# Patient Record
Sex: Male | Born: 1955 | Race: Black or African American | Hispanic: No | Smoking: Current every day smoker
Health system: Southern US, Community
[De-identification: ages and names within clinical notes are randomized; demographics above are authoritative.]

## PROBLEM LIST (undated history)

## (undated) DIAGNOSIS — M1990 Unspecified osteoarthritis, unspecified site: Secondary | ICD-10-CM

## (undated) DIAGNOSIS — I10 Essential (primary) hypertension: Secondary | ICD-10-CM

## (undated) DIAGNOSIS — M109 Gout, unspecified: Secondary | ICD-10-CM

## (undated) HISTORY — PX: COLONOSCOPY: SHX174

## (undated) HISTORY — PX: TONSILLECTOMY: SUR1361

## (undated) HISTORY — PX: BACK SURGERY: SHX140

## (undated) HISTORY — PX: WRIST SURGERY: SHX841

---

## 2009-08-05 ENCOUNTER — Ambulatory Visit (HOSPITAL_COMMUNITY): Admission: RE | Admit: 2009-08-05 | Discharge: 2009-08-05 | Payer: Self-pay | Admitting: General Practice

## 2016-04-10 ENCOUNTER — Other Ambulatory Visit: Payer: Self-pay | Admitting: Neurological Surgery

## 2016-04-10 DIAGNOSIS — M4120 Other idiopathic scoliosis, site unspecified: Secondary | ICD-10-CM

## 2016-04-24 ENCOUNTER — Ambulatory Visit
Admission: RE | Admit: 2016-04-24 | Discharge: 2016-04-24 | Disposition: A | Discharge status: Home / Self Care | Payer: Managed Care, Other (non HMO) | Source: Ambulatory Visit | Attending: Neurological Surgery | Admitting: Neurological Surgery

## 2016-04-24 DIAGNOSIS — M4120 Other idiopathic scoliosis, site unspecified: Secondary | ICD-10-CM

## 2016-04-24 MED ORDER — ONDANSETRON HCL 4 MG/2ML IJ SOLN: 4.0000 mg | Freq: Four times a day (QID) | INTRAMUSCULAR | Status: DC | PRN

## 2016-04-24 MED ORDER — IOPAMIDOL (ISOVUE-M 200) INJECTION 41%
15.0000 mL | Freq: Once | INTRAMUSCULAR | Status: AC
  Administered 2016-04-24: 15 mL via INTRATHECAL

## 2016-04-24 MED ORDER — ONDANSETRON HCL 4 MG/2ML IJ SOLN
4.0000 mg | Freq: Once | INTRAMUSCULAR | Status: AC
  Administered 2016-04-24: 4 mg via INTRAMUSCULAR

## 2016-04-24 MED ORDER — DIAZEPAM 5 MG PO TABS
5.0000 mg | ORAL_TABLET | Freq: Once | ORAL | Status: AC
  Administered 2016-04-24: 5 mg via ORAL

## 2016-04-24 MED ORDER — MEPERIDINE HCL 100 MG/ML IJ SOLN
50.0000 mg | Freq: Once | INTRAMUSCULAR | Status: AC
  Administered 2016-04-24: 50 mg via INTRAMUSCULAR

## 2016-04-24 NOTE — Discharge Instructions (Signed)

## 2016-04-27 ENCOUNTER — Telehealth: Payer: Self-pay

## 2016-04-27 NOTE — Telephone Encounter (Signed)
Spoke with patient after his myelogram here 10/16 /17.  He is at work and doing well.  Denies headache.  jkl

## 2016-05-10 ENCOUNTER — Other Ambulatory Visit: Payer: Self-pay | Admitting: Neurological Surgery

## 2016-05-29 ENCOUNTER — Inpatient Hospital Stay (HOSPITAL_COMMUNITY)
Admission: RE | Admit: 2016-05-29 | Discharge: 2016-05-29 | Disposition: A | Discharge status: Home / Self Care | Payer: Managed Care, Other (non HMO) | Source: Ambulatory Visit

## 2016-06-06 MED ORDER — TOBRAMYCIN SULFATE 1.2 G IJ SOLR
INTRAMUSCULAR | Status: AC
  Filled 2016-06-06: qty 2.4

## 2016-06-06 MED ORDER — GENTAMICIN SULFATE 40 MG/ML IJ SOLN
INTRAMUSCULAR | Status: AC
  Filled 2016-06-06: qty 4

## 2016-06-22 ENCOUNTER — Inpatient Hospital Stay (HOSPITAL_COMMUNITY): Admission: RE | Admit: 2016-06-22 | Payer: Managed Care, Other (non HMO) | Source: Ambulatory Visit

## 2016-07-27 NOTE — Pre-Procedure Instructions (Signed)
Donald Romero  07/27/2016      [id number] [place], [place] - [address] [place] [address] Phone: [phone] Fax: [phone]    Your procedure is scheduled on Monday, January 29.  Report to [facility] Admitting at 5:30 AM                Your surgery or procedure is scheduled for 7:30 AM   Call this number if you have problems the morning of surgery:[phone]                For any other questions, please call [phone], Monday - Friday 8 AM - 4 PM.     Remember:  Do not eat food or drink liquids after midnight Sunday, January 28.  Take these medicines the morning of surgery with A SIP OF WATER :amLODipine (NORVASC), buPROPion (WELLBUTRIN SR).                  1 Week prior to surgery STOP taking Aspirin, Aspirin Products (Goody Powder, Excedrin Migraine), Ibuprofen (Advil), Naproxen (Aleve) ,indomethacin (INDOCIN),  Vitamins and Herbal Products (ie Fish Oil).                 [place]- Preparing For Surgery  Before surgery, you can play an important role. Because skin is not sterile, your skin needs to be as free of germs as possible. You can reduce the number of germs on your skin by washing with CHG (chlorahexidine gluconate) Soap before surgery.  CHG is an antiseptic cleaner which kills germs and bonds with the skin to continue killing germs even after washing.  Please do not use if you have an allergy to CHG or antibacterial soaps. If your skin becomes reddened/irritated stop using the CHG.  Do not shave (including legs and underarms) for at least 48 hours prior to first CHG shower. It is OK to shave your face.  Please follow these instructions carefully.   1. Shower the NIGHT BEFORE SURGERY and the MORNING OF SURGERY with CHG.   2. If you chose to wash your hair, wash your hair first as usual with your normal shampoo.  3. After you shampoo, rinse your hair and body thoroughly to remove the shampoo.  4. Use CHG as you would  any other liquid soap. You can apply CHG directly to the skin and wash gently with a scrungie or a clean washcloth.   5. Apply the CHG Soap to your body ONLY FROM THE NECK DOWN.  Do not use on open wounds or open sores. Avoid contact with your eyes, ears, mouth and genitals (private parts). Wash genitals (private parts) with your normal soap.  6. Wash thoroughly, paying special attention to the area where your surgery will be performed.  7. Thoroughly rinse your body with warm water from the neck down.  8. DO NOT shower/wash with your normal soap after using and rinsing off the CHG Soap.  9. Pat yourself dry with a CLEAN TOWEL.   10. Wear CLEAN PAJAMAS   11. Place CLEAN SHEETS on your bed the night of your first shower and DO NOT SLEEP WITH PETS.  Day of Surgery: Do not apply any deodorants/lotions. Please wear clean clothes to the hospital/surgery center.    Do not wear jewelry, make-up or nail polish.  Do not wear lotions, powders, or perfumes, or deodorant.  Men may shave face and neck.  Do not bring valuables to the  hospital.  [facility] is not responsible for any belongings or valuables.  Contacts, dentures or bridgework may not be worn into surgery.  Leave your suitcase in the car.  After surgery it may be brought to your room.  For patients admitted to the hospital, discharge time will be determined by your treatment team.  Patients discharged the day of surgery will not be allowed to drive home.   Special instructions:  -  Please read over the following fact sheets that you were given: [facility]- Preparing For Surgery and Patient Instructions for Mupirocin Application, Coughing and Deep Breathing, Pain Booklet

## 2016-07-28 ENCOUNTER — Encounter (HOSPITAL_COMMUNITY)
Admission: RE | Admit: 2016-07-28 | Discharge: 2016-07-28 | Disposition: A | Discharge status: Home / Self Care | Payer: Managed Care, Other (non HMO) | Source: Ambulatory Visit | Attending: Neurological Surgery | Admitting: Neurological Surgery

## 2016-07-28 ENCOUNTER — Encounter (HOSPITAL_COMMUNITY): Payer: Self-pay

## 2016-07-28 DIAGNOSIS — I10 Essential (primary) hypertension: Secondary | ICD-10-CM | POA: Diagnosis present

## 2016-07-28 DIAGNOSIS — Z01812 Encounter for preprocedural laboratory examination: Secondary | ICD-10-CM | POA: Diagnosis present

## 2016-07-28 DIAGNOSIS — Z01810 Encounter for preprocedural cardiovascular examination: Secondary | ICD-10-CM | POA: Insufficient documentation

## 2016-07-28 HISTORY — DX: Unspecified osteoarthritis, unspecified site: M19.90

## 2016-07-28 HISTORY — DX: Essential (primary) hypertension: I10

## 2016-07-28 HISTORY — DX: Gout, unspecified: M10.9

## 2016-07-28 LAB — CBC
HEMATOCRIT: 42.2 % (ref 39.0–52.0)
HEMOGLOBIN: 14.1 g/dL (ref 13.0–17.0)
MCH: 29.9 pg (ref 26.0–34.0)
MCHC: 33.4 g/dL (ref 30.0–36.0)
MCV: 89.4 fL (ref 78.0–100.0)
Platelets: 240 10*3/uL (ref 150–400)
RBC: 4.72 MIL/uL (ref 4.22–5.81)
RDW: 13.9 % (ref 11.5–15.5)
WBC: 8.7 10*3/uL (ref 4.0–10.5)

## 2016-07-28 LAB — BASIC METABOLIC PANEL
Anion gap: 11 (ref 5–15)
BUN: 20 mg/dL (ref 6–20)
CALCIUM: 9.7 mg/dL (ref 8.9–10.3)
CO2: 21 mmol/L — AB (ref 22–32)
CREATININE: 1.85 mg/dL — AB (ref 0.61–1.24)
Chloride: 108 mmol/L (ref 101–111)
GFR calc non Af Amer: 38 mL/min — ABNORMAL LOW (ref >60)
GFR, EST AFRICAN AMERICAN: 44 mL/min — AB (ref >60)
GLUCOSE: 121 mg/dL — AB (ref 65–99)
Potassium: 3.9 mmol/L (ref 3.5–5.1)
Sodium: 140 mmol/L (ref 135–145)

## 2016-07-28 LAB — APTT: aPTT: 27 seconds (ref 24–36)

## 2016-07-28 LAB — TYPE AND SCREEN
ABO/RH(D): B POS
Antibody Screen: NEGATIVE

## 2016-07-28 LAB — SURGICAL PCR SCREEN
MRSA, PCR: INVALID — AB
STAPHYLOCOCCUS AUREUS: INVALID — AB

## 2016-07-28 LAB — ABO/RH: ABO/RH(D): B POS

## 2016-07-28 LAB — PROTIME-INR
INR: 0.98
PROTHROMBIN TIME: 13 s (ref 11.4–15.2)

## 2016-07-28 NOTE — Progress Notes (Signed)
PCR result was late in resulting, called [facility] and they stated that they had run it twice and showed "inconclusive". They are running a culture on it and should have the results Saturday AM.

## 2016-07-28 NOTE — Progress Notes (Signed)
PCP - Dr. Harvel Qualeajendra S. Brent Bullarivedi, MD Cardiologist - denies  Chest x-ray - not needed EKG - 07/28/16 Stress Test - denies ECHO - denies Cardiac Cath - denies Sleep Study - denies  Requesting records from PCP   Patient denies shortness of breath, fever, cough and chest pain at PAT appointment   Patient verbalized understanding of instructions that was given to them at the PAT appointment. Patient expressed that there were no further questions.  Patient was also instructed that they will need to review over the PAT instructions again at home before the surgery.

## 2016-07-30 LAB — MRSA CULTURE: Culture: NOT DETECTED

## 2016-07-31 ENCOUNTER — Encounter (HOSPITAL_COMMUNITY): Payer: Self-pay | Admitting: Emergency Medicine

## 2016-07-31 NOTE — Progress Notes (Addendum)
Anesthesia Chart Review:  Pt is a 61 year old male scheduled for L2-S1 laminectomy for decompression, T10-pelvis fixation/fusion with robotic assistance on 08/07/2016 with Cherrie DistanceBenjamin Ditty, MD.   - PCP is Glori Bickersajendra Trivedi, [place] in [place]  PMH includes:  HTN. Current smoker. BMI 31  Medications include: amlodipine.   Preoperative labs reviewed.  Cr 1.85, BUN 20. This is consistent with labs from PCP's office dated 07/13/16.   EKG 07/28/16: NSR.   Rica Mastngela Kabbe, FNP-BC [facility]/Anesthesiology Phone: [phone] 07/31/2016 4:47 PM  Addendum: Beverely LowJeannie from Dr. Emeterio Reeverivedi's office called. She reported that he had reviewed PAT labs forwarded by Rica MastAngela Kabbe. He recommends that patient be evaluated by nephrology prior to surgery. Patient apparently has an appointment at Dr. Emeterio Reeverivedi's office on 08/03/16 and nephrology referral will be initiated at that time. I have left a voice message with Shanda BumpsJessica at Dr. Mervyn Gayitty's office regarding this.  Velna Ochsllison Perle Gibbon, PA-C [facility]/Anesthesiology Phone [phone] 08/01/2016 9:12 AM

## 2016-08-07 ENCOUNTER — Encounter (HOSPITAL_COMMUNITY): Admission: RE | Payer: Self-pay | Source: Ambulatory Visit

## 2016-08-07 ENCOUNTER — Inpatient Hospital Stay (HOSPITAL_COMMUNITY)
Admission: RE | Admit: 2016-08-07 | Payer: Managed Care, Other (non HMO) | Source: Ambulatory Visit | Admitting: Neurological Surgery

## 2016-08-07 SURGERY — POSTERIOR LUMBAR FUSION 4 LEVEL
Anesthesia: General

## 2016-08-16 ENCOUNTER — Other Ambulatory Visit: Payer: Self-pay | Admitting: Neurological Surgery

## 2016-08-22 ENCOUNTER — Other Ambulatory Visit (HOSPITAL_COMMUNITY): Payer: Self-pay | Admitting: Neurological Surgery

## 2016-08-22 DIAGNOSIS — M5416 Radiculopathy, lumbar region: Secondary | ICD-10-CM

## 2016-09-11 ENCOUNTER — Encounter (HOSPITAL_COMMUNITY)
Admission: RE | Admit: 2016-09-11 | Discharge: 2016-09-11 | Disposition: A | Discharge status: Home / Self Care | Payer: 59 | Source: Ambulatory Visit | Attending: Neurological Surgery | Admitting: Neurological Surgery

## 2016-09-11 ENCOUNTER — Other Ambulatory Visit: Payer: Self-pay

## 2016-09-11 DIAGNOSIS — Z01812 Encounter for preprocedural laboratory examination: Secondary | ICD-10-CM | POA: Insufficient documentation

## 2016-09-11 LAB — BASIC METABOLIC PANEL
ANION GAP: 8 (ref 5–15)
BUN: 23 mg/dL — ABNORMAL HIGH (ref 6–20)
CALCIUM: 9.8 mg/dL (ref 8.9–10.3)
CO2: 23 mmol/L (ref 22–32)
CREATININE: 1.75 mg/dL — AB (ref 0.61–1.24)
Chloride: 107 mmol/L (ref 101–111)
GFR, EST AFRICAN AMERICAN: 47 mL/min — AB (ref >60)
GFR, EST NON AFRICAN AMERICAN: 41 mL/min — AB (ref >60)
Glucose, Bld: 112 mg/dL — ABNORMAL HIGH (ref 65–99)
Potassium: 4.2 mmol/L (ref 3.5–5.1)
SODIUM: 138 mmol/L (ref 135–145)

## 2016-09-11 LAB — CBC
HCT: 43 % (ref 39.0–52.0)
Hemoglobin: 14.4 g/dL (ref 13.0–17.0)
MCH: 29.8 pg (ref 26.0–34.0)
MCHC: 33.5 g/dL (ref 30.0–36.0)
MCV: 89 fL (ref 78.0–100.0)
Platelets: 278 10*3/uL (ref 150–400)
RBC: 4.83 MIL/uL (ref 4.22–5.81)
RDW: 13.8 % (ref 11.5–15.5)
WBC: 7.7 10*3/uL (ref 4.0–10.5)

## 2016-09-11 LAB — SURGICAL PCR SCREEN
MRSA, PCR: NEGATIVE
STAPHYLOCOCCUS AUREUS: NEGATIVE

## 2016-09-11 MED ORDER — CHLORHEXIDINE GLUCONATE CLOTH 2 % EX PADS: 6.0000 | MEDICATED_PAD | Freq: Once | CUTANEOUS | Status: DC

## 2016-09-11 NOTE — Progress Notes (Addendum)
PCP: Dr. Su Hoffrevetti  Cardiologist:pt denies  EKG: 07/2016  Stress test: pt denies ever  ECHO: pt denies ever  Cardiac Cath: pt denies ever  Chest x-ray: pt denies past year

## 2016-09-11 NOTE — Pre-Procedure Instructions (Addendum)
    Quincy CarnesJames R Intrieri  09/11/2016      [id number] [place]- [place], [place] - [address] [place] [id number] Phone: [phone] Fax: [phone]  [facility] [phone][id number] [place], [place] - [address] [facility] [address] [place] [id number] Phone: [phone] Fax: [phone]    Your procedure is scheduled on Mon. Mar. 12 @ 730 AM.  [facility] at 530 [facility].M.  Call this number if you have problems the morning of surgery:  [phone]   Remember:  Do not eat food or drink liquids after midnight.  Take these medicines the morning of surgery with A SIP OF WATER acetaminophen (tylenol).  Stop taking indomethacin (indocin), any herbal medication or vitamins, Goody's, Bc's, Aleve, Ibuprofen, Advil now.   Do not wear jewelry, make-up or nail polish.  Do not wear lotions, powders, or colognes, or deoderant.    Men may shave face and neck.  Do not bring valuables to the hospital.   [facility] is not responsible for any belongings or valuables.  Contacts, dentures or bridgework may not be worn into surgery.  Leave your suitcase in the car.  After surgery it may be brought to your room.  For patients admitted to the hospital, discharge time will be determined by your treatment team.  Patients discharged the day of surgery will not be allowed to drive home.    Special instructions:See attached  Please read over the following fact sheets that you were given. Pain Booklet, Coughing and Deep Breathing, MRSA Information and Surgical Site Infection Prevention

## 2016-09-12 NOTE — Progress Notes (Signed)
Anesthesia Chart Review:  Pt is a 61 year old male scheduled for L2-S1 laminectomy for decompression, T10-pelvis fixation/fusion with robotic assistance on 09/18/2016 with Cherrie DistanceBenjamin Ditty, MD.   Surgery was originally scheduled for 08/07/2016 but was postponed so patient can get clearance from his nephrologist.  - PCP is Glori Bickersajendra Trivedi, [place] in [place], [place] - Nephrologist is Elby BeckFlorencio Garcia, MD who cleared pt for surgery (letter on paper chart)  PMH includes:  HTN, CKD (stage III). Current smoker. BMI 31  Medications include: amlodipine, hydralazine.   Preoperative labs reviewed.  Cr 1.75, BUN 23, which is consistent with baseline Cr per nephrologist  EKG 09/11/16: Sinus tachycardia (103 bpm). Nonspecific T wave abnormality new since 07/28/2016  If no changes, I anticipate pt can proceed with surgery as scheduled.   Rica Mastngela Lexey Fletes, FNP-BC [facility]/Anesthesiology Phone: [phone] 09/12/2016 3:55 PM

## 2016-09-17 NOTE — Anesthesia Preprocedure Evaluation (Addendum)
Anesthesia Evaluation  Patient identified by MRN, date of birth, ID band Patient awake    Reviewed: Allergy & Precautions, H&P , NPO status , Patient's Chart, lab work & pertinent test results, reviewed documented beta blocker date and time   Airway Mallampati: II  TM Distance: >3 FB Neck ROM: full    Dental no notable dental hx. (+) Edentulous Upper, Edentulous Lower   Pulmonary neg pulmonary ROS, Current Smoker,    Pulmonary exam normal breath sounds clear to auscultation       Cardiovascular Exercise Tolerance: Good hypertension, negative cardio ROS   Rhythm:Regular Rate:Normal     Neuro/Psych negative neurological ROS  negative psych ROS   GI/Hepatic negative GI ROS, Neg liver ROS,   Endo/Other  negative endocrine ROS  Renal/GU negative Renal ROS  negative genitourinary   Musculoskeletal  (+) Arthritis , Osteoarthritis,    Abdominal   Peds  Hematology negative hematology ROS (+)   Anesthesia Other Findings   Reproductive/Obstetrics negative OB ROS                           Anesthesia Physical Anesthesia Plan  ASA: II  Anesthesia Plan: General   Post-op Pain Management:    Induction: Intravenous  Airway Management Planned: Oral ETT  Additional Equipment:   Intra-op Plan:   Post-operative Plan: Extubation in OR  Informed Consent: I have reviewed the patients History and Physical, chart, labs and discussed the procedure including the risks, benefits and alternatives for the proposed anesthesia with the patient or authorized representative who has indicated his/her understanding and acceptance.   Dental advisory given  Plan Discussed with: CRNA  Anesthesia Plan Comments: (CKD stage 3, ok per nephrology to proceed)       Anesthesia Quick Evaluation

## 2016-09-18 ENCOUNTER — Inpatient Hospital Stay (HOSPITAL_COMMUNITY)
Admission: RE | Admit: 2016-09-18 | Discharge: 2016-09-22 | DRG: 457 | Disposition: A | Discharge status: Rehabilitation Facility | Payer: 59 | Source: Ambulatory Visit | Attending: Neurological Surgery | Admitting: Neurological Surgery

## 2016-09-18 ENCOUNTER — Inpatient Hospital Stay (HOSPITAL_COMMUNITY): Payer: 59

## 2016-09-18 ENCOUNTER — Inpatient Hospital Stay (HOSPITAL_COMMUNITY): Payer: 59 | Admitting: Anesthesiology

## 2016-09-18 ENCOUNTER — Encounter (HOSPITAL_COMMUNITY)
Admission: RE | Disposition: A | Discharge status: Rehabilitation Facility | Payer: Self-pay | Source: Ambulatory Visit | Attending: Neurological Surgery

## 2016-09-18 ENCOUNTER — Encounter (HOSPITAL_COMMUNITY): Payer: Self-pay | Admitting: Certified Registered Nurse Anesthetist

## 2016-09-18 ENCOUNTER — Other Ambulatory Visit (HOSPITAL_COMMUNITY): Payer: Self-pay

## 2016-09-18 ENCOUNTER — Inpatient Hospital Stay (HOSPITAL_COMMUNITY): Payer: 59 | Admitting: Vascular Surgery

## 2016-09-18 DIAGNOSIS — M1990 Unspecified osteoarthritis, unspecified site: Secondary | ICD-10-CM | POA: Diagnosis present

## 2016-09-18 DIAGNOSIS — M4106 Infantile idiopathic scoliosis, lumbar region: Secondary | ICD-10-CM | POA: Diagnosis not present

## 2016-09-18 DIAGNOSIS — G8918 Other acute postprocedural pain: Secondary | ICD-10-CM

## 2016-09-18 DIAGNOSIS — M419 Scoliosis, unspecified: Secondary | ICD-10-CM | POA: Diagnosis present

## 2016-09-18 DIAGNOSIS — N183 Chronic kidney disease, stage 3 (moderate): Secondary | ICD-10-CM | POA: Diagnosis present

## 2016-09-18 DIAGNOSIS — I10 Essential (primary) hypertension: Secondary | ICD-10-CM | POA: Diagnosis present

## 2016-09-18 DIAGNOSIS — E46 Unspecified protein-calorie malnutrition: Secondary | ICD-10-CM | POA: Diagnosis not present

## 2016-09-18 DIAGNOSIS — R000 Tachycardia, unspecified: Secondary | ICD-10-CM

## 2016-09-18 DIAGNOSIS — J95821 Acute postprocedural respiratory failure: Secondary | ICD-10-CM | POA: Diagnosis not present

## 2016-09-18 DIAGNOSIS — D62 Acute posthemorrhagic anemia: Secondary | ICD-10-CM | POA: Diagnosis not present

## 2016-09-18 DIAGNOSIS — F17210 Nicotine dependence, cigarettes, uncomplicated: Secondary | ICD-10-CM | POA: Diagnosis present

## 2016-09-18 DIAGNOSIS — E875 Hyperkalemia: Secondary | ICD-10-CM | POA: Diagnosis not present

## 2016-09-18 DIAGNOSIS — N179 Acute kidney failure, unspecified: Secondary | ICD-10-CM | POA: Diagnosis not present

## 2016-09-18 DIAGNOSIS — T41295A Adverse effect of other general anesthetics, initial encounter: Secondary | ICD-10-CM | POA: Diagnosis not present

## 2016-09-18 DIAGNOSIS — I129 Hypertensive chronic kidney disease with stage 1 through stage 4 chronic kidney disease, or unspecified chronic kidney disease: Secondary | ICD-10-CM | POA: Diagnosis present

## 2016-09-18 DIAGNOSIS — M48061 Spinal stenosis, lumbar region without neurogenic claudication: Secondary | ICD-10-CM | POA: Diagnosis present

## 2016-09-18 DIAGNOSIS — Z7189 Other specified counseling: Secondary | ICD-10-CM | POA: Diagnosis not present

## 2016-09-18 DIAGNOSIS — Z981 Arthrodesis status: Secondary | ICD-10-CM

## 2016-09-18 DIAGNOSIS — M4727 Other spondylosis with radiculopathy, lumbosacral region: Secondary | ICD-10-CM | POA: Diagnosis present

## 2016-09-18 DIAGNOSIS — M549 Dorsalgia, unspecified: Secondary | ICD-10-CM | POA: Diagnosis present

## 2016-09-18 DIAGNOSIS — Z9889 Other specified postprocedural states: Secondary | ICD-10-CM | POA: Diagnosis not present

## 2016-09-18 DIAGNOSIS — R5381 Other malaise: Secondary | ICD-10-CM | POA: Diagnosis not present

## 2016-09-18 DIAGNOSIS — E861 Hypovolemia: Secondary | ICD-10-CM | POA: Diagnosis not present

## 2016-09-18 DIAGNOSIS — D696 Thrombocytopenia, unspecified: Secondary | ICD-10-CM | POA: Diagnosis not present

## 2016-09-18 DIAGNOSIS — J9601 Acute respiratory failure with hypoxia: Secondary | ICD-10-CM | POA: Diagnosis not present

## 2016-09-18 DIAGNOSIS — M109 Gout, unspecified: Secondary | ICD-10-CM | POA: Diagnosis not present

## 2016-09-18 DIAGNOSIS — I952 Hypotension due to drugs: Secondary | ICD-10-CM | POA: Diagnosis not present

## 2016-09-18 DIAGNOSIS — M4120 Other idiopathic scoliosis, site unspecified: Secondary | ICD-10-CM | POA: Diagnosis not present

## 2016-09-18 DIAGNOSIS — Z419 Encounter for procedure for purposes other than remedying health state, unspecified: Secondary | ICD-10-CM | POA: Diagnosis not present

## 2016-09-18 DIAGNOSIS — Z01818 Encounter for other preprocedural examination: Secondary | ICD-10-CM

## 2016-09-18 DIAGNOSIS — M4125 Other idiopathic scoliosis, thoracolumbar region: Secondary | ICD-10-CM | POA: Diagnosis not present

## 2016-09-18 DIAGNOSIS — M4186 Other forms of scoliosis, lumbar region: Principal | ICD-10-CM | POA: Diagnosis present

## 2016-09-18 DIAGNOSIS — M4126 Other idiopathic scoliosis, lumbar region: Secondary | ICD-10-CM | POA: Diagnosis not present

## 2016-09-18 DIAGNOSIS — M6282 Rhabdomyolysis: Secondary | ICD-10-CM | POA: Diagnosis not present

## 2016-09-18 DIAGNOSIS — M5416 Radiculopathy, lumbar region: Secondary | ICD-10-CM | POA: Diagnosis not present

## 2016-09-18 DIAGNOSIS — M10062 Idiopathic gout, left knee: Secondary | ICD-10-CM | POA: Diagnosis not present

## 2016-09-18 DIAGNOSIS — M1000 Idiopathic gout, unspecified site: Secondary | ICD-10-CM | POA: Diagnosis not present

## 2016-09-18 DIAGNOSIS — J9600 Acute respiratory failure, unspecified whether with hypoxia or hypercapnia: Secondary | ICD-10-CM

## 2016-09-18 DIAGNOSIS — Z79899 Other long term (current) drug therapy: Secondary | ICD-10-CM | POA: Diagnosis not present

## 2016-09-18 DIAGNOSIS — Z978 Presence of other specified devices: Secondary | ICD-10-CM

## 2016-09-18 DIAGNOSIS — M10042 Idiopathic gout, left hand: Secondary | ICD-10-CM | POA: Diagnosis not present

## 2016-09-18 DIAGNOSIS — Z4659 Encounter for fitting and adjustment of other gastrointestinal appliance and device: Secondary | ICD-10-CM

## 2016-09-18 HISTORY — PX: APPLICATION OF ROBOTIC ASSISTANCE FOR SPINAL PROCEDURE: SHX6753

## 2016-09-18 HISTORY — PX: POSTERIOR LUMBAR FUSION 4 LEVEL: SHX6037

## 2016-09-18 LAB — CK TOTAL AND CKMB (NOT AT ARMC)
CK TOTAL: 7166 U/L — AB (ref 49–397)
CK, MB: 30.2 ng/mL — ABNORMAL HIGH (ref 0.5–5.0)
Relative Index: 0.4 (ref 0.0–2.5)

## 2016-09-18 LAB — CBC
HCT: 29.3 % — ABNORMAL LOW (ref 39.0–52.0)
Hemoglobin: 9.6 g/dL — ABNORMAL LOW (ref 13.0–17.0)
MCH: 29 pg (ref 26.0–34.0)
MCHC: 32.8 g/dL (ref 30.0–36.0)
MCV: 88.5 fL (ref 78.0–100.0)
PLATELETS: 152 10*3/uL (ref 150–400)
RBC: 3.31 MIL/uL — AB (ref 4.22–5.81)
RDW: 13.9 % (ref 11.5–15.5)
WBC: 15.3 10*3/uL — ABNORMAL HIGH (ref 4.0–10.5)

## 2016-09-18 LAB — BLOOD GAS, ARTERIAL
Acid-base deficit: 7.4 mmol/L — ABNORMAL HIGH (ref 0.0–2.0)
BICARBONATE: 18.9 mmol/L — AB (ref 20.0–28.0)
Drawn by: 404151
FIO2: 40
LHR: 14 {breaths}/min
MECHVT: 450 mL
O2 SAT: 82.9 %
PATIENT TEMPERATURE: 98.6
PCO2 ART: 47.7 mmHg (ref 32.0–48.0)
PEEP/CPAP: 5 cmH2O
PH ART: 7.223 — AB (ref 7.350–7.450)
pO2, Arterial: 60.2 mmHg — ABNORMAL LOW (ref 83.0–108.0)

## 2016-09-18 LAB — BASIC METABOLIC PANEL
Anion gap: 12 (ref 5–15)
BUN: 31 mg/dL — AB (ref 6–20)
CO2: 19 mmol/L — ABNORMAL LOW (ref 22–32)
CREATININE: 3.2 mg/dL — AB (ref 0.61–1.24)
Calcium: 7.9 mg/dL — ABNORMAL LOW (ref 8.9–10.3)
Chloride: 104 mmol/L (ref 101–111)
GFR, EST AFRICAN AMERICAN: 23 mL/min — AB (ref >60)
GFR, EST NON AFRICAN AMERICAN: 20 mL/min — AB (ref >60)
Glucose, Bld: 239 mg/dL — ABNORMAL HIGH (ref 65–99)
Potassium: 6.4 mmol/L (ref 3.5–5.1)
SODIUM: 135 mmol/L (ref 135–145)

## 2016-09-18 SURGERY — POSTERIOR LUMBAR FUSION 4 LEVEL
Anesthesia: General

## 2016-09-18 MED ORDER — PHENYLEPHRINE 40 MCG/ML (10ML) SYRINGE FOR IV PUSH (FOR BLOOD PRESSURE SUPPORT)
PREFILLED_SYRINGE | INTRAVENOUS | Status: AC
  Filled 2016-09-18: qty 10

## 2016-09-18 MED ORDER — CEFAZOLIN SODIUM 1 G IJ SOLR
INTRAMUSCULAR | Status: AC
  Filled 2016-09-18: qty 20

## 2016-09-18 MED ORDER — METHOCARBAMOL 500 MG PO TABS
500.0000 mg | ORAL_TABLET | Freq: Four times a day (QID) | ORAL | Status: DC | PRN
  Administered 2016-09-19: 500 mg via ORAL
  Filled 2016-09-18: qty 1

## 2016-09-18 MED ORDER — THROMBIN 5000 UNITS EX SOLR
CUTANEOUS | Status: DC | PRN
  Administered 2016-09-18: 14:00:00 via TOPICAL
  Administered 2016-09-18: 5 mL via TOPICAL

## 2016-09-18 MED ORDER — CHLORHEXIDINE GLUCONATE 0.12% ORAL RINSE (MEDLINE KIT)
15.0000 mL | Freq: Two times a day (BID) | OROMUCOSAL | Status: DC
  Administered 2016-09-18 – 2016-09-19 (×2): 15 mL via OROMUCOSAL

## 2016-09-18 MED ORDER — FENTANYL CITRATE (PF) 100 MCG/2ML IJ SOLN
INTRAMUSCULAR | Status: AC
  Filled 2016-09-18: qty 8

## 2016-09-18 MED ORDER — BUPIVACAINE-EPINEPHRINE (PF) 0.5% -1:200000 IJ SOLN
INTRAMUSCULAR | Status: DC | PRN
  Administered 2016-09-18: 15 mL

## 2016-09-18 MED ORDER — ORAL CARE MOUTH RINSE
15.0000 mL | OROMUCOSAL | Status: DC
  Administered 2016-09-18 – 2016-09-19 (×6): 15 mL via OROMUCOSAL

## 2016-09-18 MED ORDER — HYDROCODONE-ACETAMINOPHEN 7.5-325 MG PO TABS
1.0000 | ORAL_TABLET | Freq: Four times a day (QID) | ORAL | Status: DC
  Administered 2016-09-18 – 2016-09-22 (×13): 1 via ORAL
  Filled 2016-09-18 (×13): qty 1

## 2016-09-18 MED ORDER — HYDROMORPHONE HCL 1 MG/ML IJ SOLN: 0.2500 mg | INTRAMUSCULAR | Status: DC | PRN

## 2016-09-18 MED ORDER — SUGAMMADEX SODIUM 500 MG/5ML IV SOLN
INTRAVENOUS | Status: AC
  Filled 2016-09-18: qty 5

## 2016-09-18 MED ORDER — THROMBIN 5000 UNITS EX SOLR
CUTANEOUS | Status: AC
  Filled 2016-09-18: qty 5000

## 2016-09-18 MED ORDER — CELECOXIB 200 MG PO CAPS
200.0000 mg | ORAL_CAPSULE | Freq: Two times a day (BID) | ORAL | Status: DC
  Administered 2016-09-18: 200 mg via ORAL
  Filled 2016-09-18 (×2): qty 1

## 2016-09-18 MED ORDER — PROPOFOL 10 MG/ML IV BOLUS
INTRAVENOUS | Status: AC
  Filled 2016-09-18: qty 20

## 2016-09-18 MED ORDER — ZOLPIDEM TARTRATE 5 MG PO TABS: 5.0000 mg | ORAL_TABLET | Freq: Every evening | ORAL | Status: DC | PRN

## 2016-09-18 MED ORDER — FENTANYL CITRATE (PF) 100 MCG/2ML IJ SOLN
INTRAMUSCULAR | Status: AC
  Filled 2016-09-18: qty 4

## 2016-09-18 MED ORDER — BISACODYL 10 MG RE SUPP: 10.0000 mg | Freq: Every day | RECTAL | Status: DC | PRN

## 2016-09-18 MED ORDER — FENTANYL CITRATE (PF) 100 MCG/2ML IJ SOLN
INTRAMUSCULAR | Status: AC
  Administered 2016-09-18: 50 ug via INTRAVENOUS
  Filled 2016-09-18: qty 2

## 2016-09-18 MED ORDER — SODIUM CHLORIDE 0.9 % IR SOLN
Status: DC | PRN
  Administered 2016-09-18 (×2): 500 mL

## 2016-09-18 MED ORDER — CEFAZOLIN SODIUM-DEXTROSE 2-4 GM/100ML-% IV SOLN
2.0000 g | Freq: Three times a day (TID) | INTRAVENOUS | Status: AC
  Administered 2016-09-18 – 2016-09-19 (×2): 2 g via INTRAVENOUS
  Filled 2016-09-18 (×2): qty 100

## 2016-09-18 MED ORDER — HYDROMORPHONE HCL 1 MG/ML IJ SOLN
INTRAMUSCULAR | Status: AC
  Filled 2016-09-18: qty 1

## 2016-09-18 MED ORDER — VANCOMYCIN HCL 1000 MG IV SOLR
INTRAVENOUS | Status: DC | PRN
  Administered 2016-09-18: 1000 mg

## 2016-09-18 MED ORDER — PROPOFOL 1000 MG/100ML IV EMUL
5.0000 ug/kg/min | INTRAVENOUS | Status: DC
  Administered 2016-09-18: 5 ug/kg/min via INTRAVENOUS
  Filled 2016-09-18: qty 100

## 2016-09-18 MED ORDER — LACTATED RINGERS IV SOLN
INTRAVENOUS | Status: DC | PRN
  Administered 2016-09-18 (×5): via INTRAVENOUS

## 2016-09-18 MED ORDER — PHENOL 1.4 % MT LIQD: 1.0000 | OROMUCOSAL | Status: DC | PRN

## 2016-09-18 MED ORDER — 0.9 % SODIUM CHLORIDE (POUR BTL) OPTIME
TOPICAL | Status: DC | PRN
  Administered 2016-09-18 (×2): 1000 mL

## 2016-09-18 MED ORDER — CEFAZOLIN SODIUM-DEXTROSE 2-4 GM/100ML-% IV SOLN
2.0000 g | INTRAVENOUS | Status: AC
  Administered 2016-09-18 (×2): 2 g via INTRAVENOUS
  Filled 2016-09-18: qty 100

## 2016-09-18 MED ORDER — SENNOSIDES-DOCUSATE SODIUM 8.6-50 MG PO TABS: 1.0000 | ORAL_TABLET | Freq: Every evening | ORAL | Status: DC | PRN

## 2016-09-18 MED ORDER — SODIUM CHLORIDE 0.9 % IJ SOLN
INTRAMUSCULAR | Status: AC
  Filled 2016-09-18: qty 10

## 2016-09-18 MED ORDER — VANCOMYCIN HCL 1000 MG IV SOLR
INTRAVENOUS | Status: AC
Start: 2016-09-18 — End: 2016-09-18
  Filled 2016-09-18: qty 1000

## 2016-09-18 MED ORDER — FENTANYL CITRATE (PF) 100 MCG/2ML IJ SOLN
INTRAMUSCULAR | Status: DC | PRN
  Administered 2016-09-18: 100 ug via INTRAVENOUS
  Administered 2016-09-18: 150 ug via INTRAVENOUS
  Administered 2016-09-18 (×2): 50 ug via INTRAVENOUS
  Administered 2016-09-18 (×3): 100 ug via INTRAVENOUS

## 2016-09-18 MED ORDER — MIDAZOLAM HCL 2 MG/2ML IJ SOLN
INTRAMUSCULAR | Status: AC
  Filled 2016-09-18: qty 2

## 2016-09-18 MED ORDER — KETOROLAC TROMETHAMINE 30 MG/ML IJ SOLN
INTRAMUSCULAR | Status: AC
  Filled 2016-09-18: qty 1

## 2016-09-18 MED ORDER — ALBUMIN HUMAN 5 % IV SOLN
INTRAVENOUS | Status: DC | PRN
  Administered 2016-09-18 (×3): via INTRAVENOUS

## 2016-09-18 MED ORDER — SODIUM POLYSTYRENE SULFONATE 15 GM/60ML PO SUSP
30.0000 g | Freq: Once | ORAL | Status: AC
  Administered 2016-09-18: 30 g
  Filled 2016-09-18: qty 120

## 2016-09-18 MED ORDER — DEXAMETHASONE SODIUM PHOSPHATE 4 MG/ML IJ SOLN
INTRAMUSCULAR | Status: DC | PRN
  Administered 2016-09-18: 10 mg via INTRAVENOUS

## 2016-09-18 MED ORDER — LIDOCAINE-EPINEPHRINE 2 %-1:100000 IJ SOLN
INTRAMUSCULAR | Status: DC | PRN
  Administered 2016-09-18: 15 mL

## 2016-09-18 MED ORDER — LIDOCAINE-EPINEPHRINE 2 %-1:100000 IJ SOLN
INTRAMUSCULAR | Status: AC
  Filled 2016-09-18: qty 1

## 2016-09-18 MED ORDER — METHYLPREDNISOLONE ACETATE 80 MG/ML IJ SUSP
INTRAMUSCULAR | Status: AC
  Filled 2016-09-18: qty 1

## 2016-09-18 MED ORDER — METOPROLOL TARTRATE 5 MG/5ML IV SOLN
INTRAVENOUS | Status: AC
  Filled 2016-09-18: qty 5

## 2016-09-18 MED ORDER — LACTATED RINGERS IV BOLUS (SEPSIS)
500.0000 mL | Freq: Once | INTRAVENOUS | Status: AC
  Administered 2016-09-18: 500 mL via INTRAVENOUS

## 2016-09-18 MED ORDER — GABAPENTIN 300 MG PO CAPS
300.0000 mg | ORAL_CAPSULE | Freq: Three times a day (TID) | ORAL | Status: DC
  Administered 2016-09-18 – 2016-09-22 (×11): 300 mg via ORAL
  Filled 2016-09-18 (×11): qty 1

## 2016-09-18 MED ORDER — PHENYLEPHRINE HCL 10 MG/ML IJ SOLN
INTRAMUSCULAR | Status: DC | PRN
  Administered 2016-09-18 (×2): 80 ug via INTRAVENOUS
  Administered 2016-09-18: 120 ug via INTRAVENOUS
  Administered 2016-09-18: 80 ug via INTRAVENOUS
  Administered 2016-09-18: 120 ug via INTRAVENOUS
  Administered 2016-09-18: 80 ug via INTRAVENOUS

## 2016-09-18 MED ORDER — HYDROMORPHONE HCL 1 MG/ML IJ SOLN
1.0000 mg | INTRAMUSCULAR | Status: DC | PRN
  Administered 2016-09-18 – 2016-09-19 (×2): 1 mg via INTRAVENOUS
  Filled 2016-09-18 (×2): qty 1

## 2016-09-18 MED ORDER — SODIUM CHLORIDE 0.9 % IV BOLUS (SEPSIS): 1000.0000 mL | Freq: Once | INTRAVENOUS | Status: DC

## 2016-09-18 MED ORDER — PHENYLEPHRINE HCL 10 MG/ML IJ SOLN
INTRAMUSCULAR | Status: AC
  Filled 2016-09-18: qty 1

## 2016-09-18 MED ORDER — LACTATED RINGERS IV BOLUS (SEPSIS)
1000.0000 mL | Freq: Once | INTRAVENOUS | Status: AC
  Administered 2016-09-18: 1000 mL via INTRAVENOUS

## 2016-09-18 MED ORDER — SODIUM CHLORIDE 0.9% FLUSH
3.0000 mL | Freq: Two times a day (BID) | INTRAVENOUS | Status: DC
  Administered 2016-09-18: 3 mL via INTRAVENOUS

## 2016-09-18 MED ORDER — HYDROMORPHONE HCL 1 MG/ML IJ SOLN
INTRAMUSCULAR | Status: DC | PRN
  Administered 2016-09-18: .2 mg via INTRAVENOUS
  Administered 2016-09-18 (×2): .1 mg via INTRAVENOUS
  Administered 2016-09-18 (×2): .2 mg via INTRAVENOUS
  Administered 2016-09-18 (×3): .4 mg via INTRAVENOUS

## 2016-09-18 MED ORDER — PANTOPRAZOLE SODIUM 40 MG IV SOLR
40.0000 mg | Freq: Every day | INTRAVENOUS | Status: DC
  Administered 2016-09-18 – 2016-09-19 (×2): 40 mg via INTRAVENOUS
  Filled 2016-09-18 (×2): qty 40

## 2016-09-18 MED ORDER — ACETAMINOPHEN 650 MG RE SUPP: 650.0000 mg | RECTAL | Status: DC | PRN

## 2016-09-18 MED ORDER — FENTANYL CITRATE (PF) 100 MCG/2ML IJ SOLN
50.0000 ug | INTRAMUSCULAR | Status: DC | PRN
  Administered 2016-09-18: 100 ug via INTRAVENOUS
  Administered 2016-09-18: 50 ug via INTRAVENOUS
  Administered 2016-09-19 (×2): 100 ug via INTRAVENOUS
  Filled 2016-09-18 (×3): qty 2

## 2016-09-18 MED ORDER — BUPIVACAINE-EPINEPHRINE (PF) 0.5% -1:200000 IJ SOLN
INTRAMUSCULAR | Status: AC
  Filled 2016-09-18: qty 30

## 2016-09-18 MED ORDER — ONDANSETRON HCL 4 MG/2ML IJ SOLN: 4.0000 mg | Freq: Once | INTRAMUSCULAR | Status: DC | PRN

## 2016-09-18 MED ORDER — PROPOFOL 10 MG/ML IV BOLUS
INTRAVENOUS | Status: DC | PRN
  Administered 2016-09-18: 200 mg via INTRAVENOUS

## 2016-09-18 MED ORDER — ROCURONIUM BROMIDE 50 MG/5ML IV SOSY
PREFILLED_SYRINGE | INTRAVENOUS | Status: AC
  Filled 2016-09-18: qty 5

## 2016-09-18 MED ORDER — METOPROLOL TARTRATE 5 MG/5ML IV SOLN
INTRAVENOUS | Status: DC | PRN
  Administered 2016-09-18 (×3): 1 mg via INTRAVENOUS

## 2016-09-18 MED ORDER — ONDANSETRON HCL 4 MG/2ML IJ SOLN
INTRAMUSCULAR | Status: AC
  Filled 2016-09-18: qty 2

## 2016-09-18 MED ORDER — DOCUSATE SODIUM 100 MG PO CAPS
100.0000 mg | ORAL_CAPSULE | Freq: Two times a day (BID) | ORAL | Status: DC
  Administered 2016-09-19 – 2016-09-22 (×6): 100 mg via ORAL
  Filled 2016-09-18 (×6): qty 1

## 2016-09-18 MED ORDER — LIDOCAINE 2% (20 MG/ML) 5 ML SYRINGE
INTRAMUSCULAR | Status: AC
  Filled 2016-09-18: qty 5

## 2016-09-18 MED ORDER — THROMBIN 20000 UNITS EX SOLR
CUTANEOUS | Status: DC | PRN
  Administered 2016-09-18: 20 mL via TOPICAL

## 2016-09-18 MED ORDER — ONDANSETRON HCL 4 MG/2ML IJ SOLN
INTRAMUSCULAR | Status: DC | PRN
  Administered 2016-09-18: 4 mg via INTRAVENOUS

## 2016-09-18 MED ORDER — BUPIVACAINE HCL (PF) 0.25 % IJ SOLN
INTRAMUSCULAR | Status: AC
  Filled 2016-09-18: qty 30

## 2016-09-18 MED ORDER — ROCURONIUM BROMIDE 100 MG/10ML IV SOLN
INTRAVENOUS | Status: DC | PRN
  Administered 2016-09-18 (×3): 50 mg via INTRAVENOUS

## 2016-09-18 MED ORDER — MENTHOL 3 MG MT LOZG: 1.0000 | LOZENGE | OROMUCOSAL | Status: DC | PRN

## 2016-09-18 MED ORDER — SODIUM CHLORIDE 0.9% FLUSH: 3.0000 mL | INTRAVENOUS | Status: DC | PRN

## 2016-09-18 MED ORDER — ALUM & MAG HYDROXIDE-SIMETH 200-200-20 MG/5ML PO SUSP
30.0000 mL | Freq: Four times a day (QID) | ORAL | Status: DC | PRN
  Administered 2016-09-19: 30 mL via ORAL
  Filled 2016-09-18: qty 30

## 2016-09-18 MED ORDER — LIDOCAINE HCL (CARDIAC) 20 MG/ML IV SOLN
INTRAVENOUS | Status: DC | PRN
  Administered 2016-09-18: 100 mg via INTRAVENOUS

## 2016-09-18 MED ORDER — MIDAZOLAM HCL 2 MG/2ML IJ SOLN
INTRAMUSCULAR | Status: AC
  Filled 2016-09-18: qty 4

## 2016-09-18 MED ORDER — MEPERIDINE HCL 25 MG/ML IJ SOLN: 6.2500 mg | INTRAMUSCULAR | Status: DC | PRN

## 2016-09-18 MED ORDER — ACETAMINOPHEN 325 MG PO TABS
650.0000 mg | ORAL_TABLET | ORAL | Status: DC | PRN
  Administered 2016-09-22: 650 mg via ORAL
  Filled 2016-09-18: qty 2

## 2016-09-18 MED ORDER — SODIUM CHLORIDE 0.9 % IV SOLN
INTRAVENOUS | Status: DC | PRN
  Administered 2016-09-18: 14:00:00 via INTRAVENOUS

## 2016-09-18 MED ORDER — ESMOLOL HCL 100 MG/10ML IV SOLN
INTRAVENOUS | Status: DC | PRN
  Administered 2016-09-18: 20 mg via INTRAVENOUS
  Administered 2016-09-18: 10 mg via INTRAVENOUS
  Administered 2016-09-18: 20 mg via INTRAVENOUS
  Administered 2016-09-18: 10 mg via INTRAVENOUS

## 2016-09-18 MED ORDER — ROCURONIUM BROMIDE 50 MG/5ML IV SOSY
PREFILLED_SYRINGE | INTRAVENOUS | Status: AC
  Filled 2016-09-18: qty 10

## 2016-09-18 MED ORDER — THROMBIN 20000 UNITS EX SOLR
CUTANEOUS | Status: AC
  Filled 2016-09-18: qty 20000

## 2016-09-18 MED ORDER — MIDAZOLAM HCL 5 MG/5ML IJ SOLN
INTRAMUSCULAR | Status: DC | PRN
  Administered 2016-09-18: 2 mg via INTRAVENOUS
  Administered 2016-09-18: 4 mg via INTRAVENOUS

## 2016-09-18 MED ORDER — OXYCODONE HCL 5 MG PO TABS
5.0000 mg | ORAL_TABLET | ORAL | Status: DC | PRN
  Administered 2016-09-20: 10 mg via ORAL
  Administered 2016-09-21 (×2): 5 mg via ORAL
  Filled 2016-09-18: qty 2
  Filled 2016-09-18 (×2): qty 1

## 2016-09-18 MED ORDER — ONDANSETRON HCL 4 MG/2ML IJ SOLN: 4.0000 mg | Freq: Four times a day (QID) | INTRAMUSCULAR | Status: DC | PRN

## 2016-09-18 MED ORDER — LACTATED RINGERS IV SOLN
INTRAVENOUS | Status: DC
  Administered 2016-09-19: 04:00:00 via INTRAVENOUS

## 2016-09-18 MED ORDER — FENTANYL CITRATE (PF) 100 MCG/2ML IJ SOLN
INTRAMUSCULAR | Status: AC
  Filled 2016-09-18: qty 2

## 2016-09-18 MED ORDER — BUPIVACAINE LIPOSOME 1.3 % IJ SUSP
20.0000 mL | INTRAMUSCULAR | Status: AC
  Administered 2016-09-18: 20 mL
  Filled 2016-09-18: qty 20

## 2016-09-18 MED ORDER — SODIUM CHLORIDE 0.9 % IJ SOLN
INTRAMUSCULAR | Status: DC | PRN
  Administered 2016-09-18: 20 mL via INTRAVENOUS

## 2016-09-18 MED ORDER — SUGAMMADEX SODIUM 200 MG/2ML IV SOLN
INTRAVENOUS | Status: DC | PRN
  Administered 2016-09-18: 350 mg via INTRAVENOUS

## 2016-09-18 MED ORDER — SENNA 8.6 MG PO TABS
1.0000 | ORAL_TABLET | Freq: Two times a day (BID) | ORAL | Status: DC
  Administered 2016-09-18 – 2016-09-22 (×7): 8.6 mg via ORAL
  Filled 2016-09-18 (×7): qty 1

## 2016-09-18 MED ORDER — FLEET ENEMA 7-19 GM/118ML RE ENEM: 1.0000 | ENEMA | Freq: Once | RECTAL | Status: DC | PRN

## 2016-09-18 MED ORDER — PHENYLEPHRINE HCL 10 MG/ML IJ SOLN
INTRAMUSCULAR | Status: DC | PRN
  Administered 2016-09-18: 60 ug/min via INTRAVENOUS
  Administered 2016-09-18: 30 ug/min via INTRAVENOUS

## 2016-09-18 MED ORDER — ONDANSETRON HCL 4 MG PO TABS: 4.0000 mg | ORAL_TABLET | Freq: Four times a day (QID) | ORAL | Status: DC | PRN

## 2016-09-18 MED ORDER — METHOCARBAMOL 1000 MG/10ML IJ SOLN
500.0000 mg | Freq: Four times a day (QID) | INTRAVENOUS | Status: DC | PRN
  Filled 2016-09-18: qty 5

## 2016-09-18 MED ORDER — SODIUM CHLORIDE 0.9 % IV SOLN: INTRAVENOUS | Status: DC

## 2016-09-18 MED ORDER — MIDAZOLAM HCL 2 MG/2ML IJ SOLN
1.0000 mg | INTRAMUSCULAR | Status: DC | PRN
  Administered 2016-09-18: 1 mg via INTRAVENOUS
  Filled 2016-09-18: qty 2

## 2016-09-18 MED ORDER — DEXAMETHASONE SODIUM PHOSPHATE 10 MG/ML IJ SOLN
INTRAMUSCULAR | Status: AC
  Filled 2016-09-18: qty 1

## 2016-09-18 MED ORDER — SODIUM CHLORIDE 0.9 % IV SOLN: 250.0000 mL | INTRAVENOUS | Status: DC

## 2016-09-18 SURGICAL SUPPLY — 105 items
BAG DECANTER FOR FLEXI CONT (MISCELLANEOUS) ×6 IMPLANT
BENZOIN TINCTURE PRP APPL 2/3 (GAUZE/BANDAGES/DRESSINGS) ×3 IMPLANT
BIT DRILL LONG 3.0X30 (BIT) ×2 IMPLANT
BIT DRILL LONG 3X80 (BIT) ×2 IMPLANT
BIT DRILL LONG 4X80 (BIT) IMPLANT
BIT DRILL SHORT 3.0X30 (BIT) IMPLANT
BIT DRILL SHORT 3X80 (BIT) IMPLANT
BLADE CLIPPER SURG (BLADE) IMPLANT
BLADE SURG 11 STRL SS (BLADE) ×6 IMPLANT
BONE CANC CHIPS 20CC PCAN1/4 (Bone Implant) ×6 IMPLANT
BONE CANC CHIPS 40CC CAN1/2 (Bone Implant) ×12 IMPLANT
BUR MATCHSTICK NEURO 3.0 LAGG (BURR) ×3 IMPLANT
BUR ROUND FLUTED 5 RND (BURR) ×4 IMPLANT
BUR ROUND FLUTED 5MM RND (BURR) ×2
CANISTER SUCT 3000ML PPV (MISCELLANEOUS) ×6 IMPLANT
CARTRIDGE OIL MAESTRO DRILL (MISCELLANEOUS) ×1 IMPLANT
CHIPS CANC BONE 20CC PCAN1/4 (Bone Implant) ×2 IMPLANT
CHIPS CANC BONE 40CC CAN1/2 (Bone Implant) ×4 IMPLANT
CHLORAPREP W/TINT 26ML (MISCELLANEOUS) ×3 IMPLANT
CONT SPEC 4OZ CLIKSEAL STRL BL (MISCELLANEOUS) ×3 IMPLANT
DECANTER SPIKE VIAL GLASS SM (MISCELLANEOUS) ×3 IMPLANT
DERMABOND ADVANCED (GAUZE/BANDAGES/DRESSINGS) ×6
DERMABOND ADVANCED .7 DNX12 (GAUZE/BANDAGES/DRESSINGS) ×3 IMPLANT
DIFFUSER DRILL AIR PNEUMATIC (MISCELLANEOUS) ×3 IMPLANT
DRAPE C-ARM 42X72 X-RAY (DRAPES) ×9 IMPLANT
DRAPE C-ARMOR (DRAPES) ×3 IMPLANT
DRAPE HALF SHEET 40X57 (DRAPES) ×6 IMPLANT
DRAPE MICROSCOPE LEICA (MISCELLANEOUS) IMPLANT
DRAPE POUCH INSTRU U-SHP 10X18 (DRAPES) ×3 IMPLANT
DRAPE SHEET LG 3/4 BI-LAMINATE (DRAPES) ×3 IMPLANT
DRAPE SURG 17X23 STRL (DRAPES) ×3 IMPLANT
DRSG OPSITE 11X17.75 LRG (GAUZE/BANDAGES/DRESSINGS) ×6 IMPLANT
ELECT REM PT RETURN 9FT ADLT (ELECTROSURGICAL) ×3
ELECTRODE REM PT RTRN 9FT ADLT (ELECTROSURGICAL) ×1 IMPLANT
EVACUATOR 1/8 PVC DRAIN (DRAIN) ×3 IMPLANT
GAUZE SPONGE 4X4 12PLY STRL (GAUZE/BANDAGES/DRESSINGS) IMPLANT
GAUZE SPONGE 4X4 16PLY XRAY LF (GAUZE/BANDAGES/DRESSINGS) ×3 IMPLANT
GLOVE BIO SURGEON STRL SZ7 (GLOVE) ×12 IMPLANT
GLOVE BIOGEL PI IND STRL 6.5 (GLOVE) ×1 IMPLANT
GLOVE BIOGEL PI IND STRL 7.5 (GLOVE) ×3 IMPLANT
GLOVE BIOGEL PI INDICATOR 6.5 (GLOVE) ×2
GLOVE BIOGEL PI INDICATOR 7.5 (GLOVE) ×6
GLOVE ECLIPSE 9.0 STRL (GLOVE) ×6 IMPLANT
GLOVE INDICATOR 7.5 STRL GRN (GLOVE) ×3 IMPLANT
GLOVE SS BIOGEL STRL SZ 7.5 (GLOVE) ×6 IMPLANT
GLOVE SUPERSENSE BIOGEL SZ 7.5 (GLOVE) ×12
GLOVE SURG SS PI 6.5 STRL IVOR (GLOVE) ×18 IMPLANT
GOWN STRL REUS W/ TWL LRG LVL3 (GOWN DISPOSABLE) ×4 IMPLANT
GOWN STRL REUS W/ TWL XL LVL3 (GOWN DISPOSABLE) ×2 IMPLANT
GOWN STRL REUS W/TWL LRG LVL3 (GOWN DISPOSABLE) ×8
GOWN STRL REUS W/TWL XL LVL3 (GOWN DISPOSABLE) ×4
HEMOSTAT POWDER KIT SURGIFOAM (HEMOSTASIS) ×6 IMPLANT
KIT BASIN OR (CUSTOM PROCEDURE TRAY) ×3 IMPLANT
KIT INFUSE LRG II (Orthopedic Implant) ×3 IMPLANT
KIT ROOM TURNOVER OR (KITS) ×3 IMPLANT
KIT SPINE MAZOR X ROBO DISP (MISCELLANEOUS) ×4 IMPLANT
MARKER SKIN DUAL TIP RULER LAB (MISCELLANEOUS) ×3 IMPLANT
MILL MEDIUM DISP (BLADE) ×3 IMPLANT
NEEDLE HYPO 18GX1.5 BLUNT FILL (NEEDLE) ×3 IMPLANT
NEEDLE HYPO 21X1.5 SAFETY (NEEDLE) ×6 IMPLANT
NEEDLE HYPO 25X1 1.5 SAFETY (NEEDLE) ×3 IMPLANT
NS IRRIG 1000ML POUR BTL (IV SOLUTION) ×6 IMPLANT
OIL CARTRIDGE MAESTRO DRILL (MISCELLANEOUS) ×3
PACK LAMINECTOMY NEURO (CUSTOM PROCEDURE TRAY) ×3 IMPLANT
PACK UNIVERSAL I (CUSTOM PROCEDURE TRAY) ×3 IMPLANT
PAD ARMBOARD 7.5X6 YLW CONV (MISCELLANEOUS) ×9 IMPLANT
PATTIES SURGICAL .5X1.5 (GAUZE/BANDAGES/DRESSINGS) ×3 IMPLANT
PIN HEAD 2.5X60MM (PIN) IMPLANT
ROD STRT TI 5.5X300 ARSENAL (Rod) ×3 IMPLANT
ROD STRT TI 5.5X500 ARSENAL (Rod) ×2 IMPLANT
RUBBERBAND STERILE (MISCELLANEOUS) IMPLANT
SCREW LP PA 8.5X80 ARSENAL (Screw) ×6 IMPLANT
SCREW PA 6.5X40 ARSENAL (Screw) ×18 IMPLANT
SCREW PA 6.5X45 ARSENAL (Screw) ×4 IMPLANT
SCREW PA 6.5X50 ARSENAL (Screw) ×12 IMPLANT
SCREW PA 7.5X45 ARSENAL (Screw) ×4 IMPLANT
SCREW SCHANZ SA 4.0MM (MISCELLANEOUS) IMPLANT
SCREW SET SPINAL ARSENAL 47127 (Screw) ×48 IMPLANT
SEALER BIPOLAR AQUA 2.3 (INSTRUMENTS) ×3 IMPLANT
SPONGE NEURO XRAY DETECT 1X3 (DISPOSABLE) ×3 IMPLANT
SPONGE SURGIFOAM ABS GEL 100 (HEMOSTASIS) ×3 IMPLANT
STAPLER VISISTAT 35W (STAPLE) ×6 IMPLANT
STRIP SURGICAL 1 X 6 IN (GAUZE/BANDAGES/DRESSINGS) IMPLANT
STRIP SURGICAL 1/2 X 6 IN (GAUZE/BANDAGES/DRESSINGS) IMPLANT
STRIP SURGICAL 1/4 X 6 IN (GAUZE/BANDAGES/DRESSINGS) IMPLANT
STRIP SURGICAL 3/4 X 6 IN (GAUZE/BANDAGES/DRESSINGS) IMPLANT
SUT STRATAFIX 1PDS 45CM VIOLET (SUTURE) ×12 IMPLANT
SUT STRATAFIX MNCRL+ 3-0 PS-2 (SUTURE) ×3
SUT STRATAFIX MONOCRYL 3-0 (SUTURE) ×6
SUT STRATAFIX SPIRAL + 2-0 (SUTURE) ×3 IMPLANT
SUT VIC AB 0 CT1 18XCR BRD8 (SUTURE) ×2 IMPLANT
SUT VIC AB 0 CT1 8-18 (SUTURE) ×4
SUT VIC AB 2-0 CT1 18 (SUTURE) ×6 IMPLANT
SUT VIC AB 3-0 SH 8-18 (SUTURE) ×9 IMPLANT
SUT VIC AB 4-0 PS2 27 (SUTURE) ×3 IMPLANT
SUTURE STRATFX MNCRL+ 3-0 PS-2 (SUTURE) ×3 IMPLANT
SYR 30ML LL (SYRINGE) ×6 IMPLANT
SYR 5ML LL (SYRINGE) IMPLANT
TIP BLUNT NITINOL ILLICO 18 (INSTRUMENTS) ×48 IMPLANT
TOOL TUNNELING 20 INCH ×3 IMPLANT
TOWEL GREEN STERILE (TOWEL DISPOSABLE) ×2 IMPLANT
TOWEL GREEN STERILE FF (TOWEL DISPOSABLE) ×4 IMPLANT
TUBE CONNECTING 12'X1/4 (SUCTIONS) ×1
TUBE CONNECTING 12X1/4 (SUCTIONS) ×2 IMPLANT
WATER STERILE IRR 1000ML POUR (IV SOLUTION) ×3 IMPLANT

## 2016-09-18 NOTE — Transfer of Care (Signed)
Immediate Anesthesia Transfer of Care Note  Patient: Donald CarnesJames R Romero  Procedure(s) Performed: Procedure(s): Lumbar two-Sacrum one  Laminectomy for decompression with Thoracic ten  to pelvis fixation/fusion with Mazor robot (N/A) APPLICATION OF ROBOTIC ASSISTANCE FOR SPINAL PROCEDURE (N/A)  Patient Location: ICU  Anesthesia Type:General  Level of Consciousness: Patient remains intubated per anesthesia plan  Airway & Oxygen Therapy: Patient remains intubated per anesthesia plan and Patient placed on Ventilator (see vital sign flow sheet for setting)  Post-op Assessment: Report given to RN and Post -op Vital signs reviewed and stable  Post vital signs: Reviewed and stable  Last Vitals:  Vitals:   09/18/16 0608  BP: (!) 145/76  Pulse: (!) 107  Resp: 20  Temp: 37.1 C    Last Pain:  Vitals:   09/18/16 0640  TempSrc:   PainSc: 8       Patients Stated Pain Goal: 7 (09/18/16 0640)  Complications: No apparent anesthesia complications

## 2016-09-18 NOTE — Progress Notes (Signed)
Patient came from OR intubated with a 7.5 ETT taped at 21 cm at lip, BBS equal, placed on above vent settings, SATS 98%, will continue to monitor patient.

## 2016-09-18 NOTE — Anesthesia Procedure Notes (Deleted)
Anesthesia Regional Block: Narrative:       

## 2016-09-18 NOTE — Progress Notes (Signed)
   09/18/16 2200  Adult Ventilator Settings  Vent Type Servo i  Humidity HME  Vent Mode PRVC  Vt Set 600 mL  Set Rate 14 bmp  FiO2 (%) 40 %  I Time 0.9 Sec(s)

## 2016-09-18 NOTE — Assessment & Plan Note (Signed)
Probably pain and post op volume relaated  Plan Pain control Fluid bolus and 100cc/h LR infusion

## 2016-09-18 NOTE — Assessment & Plan Note (Signed)
Girl friend updated at bedside

## 2016-09-18 NOTE — Assessment & Plan Note (Signed)
fent prn  

## 2016-09-18 NOTE — H&P (Signed)
CC:  No chief complaint on file. Back and leg pain  HPI: Donald Romero is a 61 y.o. male with idiopathic scoliosis treated with T2-L2 fusion at age 61.  He presents with progressively worsening back and leg pain.  Today his pain is worse on the right than left.  PMH: Past Medical History:  Diagnosis Date  . Arthritis   . Gout   . Hypertension     PSH: Past Surgical History:  Procedure Laterality Date  . BACK SURGERY    . COLONOSCOPY    . TONSILLECTOMY    . WRIST SURGERY Right     SH: Social History  Substance Use Topics  . Smoking status: Current Every Day Smoker    Packs/day: 0.50  . Smokeless tobacco: Never Used  . Alcohol use No    MEDS: Prior to Admission medications   Medication Sig Start Date End Date Taking? Authorizing Provider  Acetaminophen (TYLENOL ARTHRITIS PAIN PO) Take 650-1,300 mg by mouth every 8 (eight) hours as needed (for pain.).   Yes Historical Provider, MD  hydrALAZINE (APRESOLINE) 100 MG tablet Take 100 mg by mouth 3 (three) times daily. 08/07/16  Yes Historical Provider, MD  indomethacin (INDOCIN) 25 MG capsule Take 25 mg by mouth 3 (three) times daily as needed (gout flare ups).  05/14/09  Yes Historical Provider, MD    ALLERGY: No Known Allergies  ROS: ROS  NEUROLOGIC EXAM: Awake, alert, oriented Memory and concentration grossly intact Speech fluent, appropriate CN grossly intact Motor exam: Upper Extremities Deltoid Bicep Tricep Grip  Right 5/5 5/5 5/5 5/5  Left 5/5 5/5 5/5 5/5   Lower Extremity IP Quad PF DF EHL  Right 4/5 5/5 4/5 4/5 4/5  Left 4/5 5/5 4/5 4/5 4/5   Sensation grossly intact to LT  IMAGING: No new imaging  IMPRESSION: - 61 y.o. male with scoliosis and lumbosacral spondylosis with radiculopathy.  Deficits as above.  PLAN: - T10-pelvis fixation and fusion; L2-S1 decompression. - We have discussed the risks, benefits, and alternatives to surgery and he wishes to proceed.

## 2016-09-18 NOTE — Progress Notes (Signed)
eLink Physician-Brief Progress Note Patient Name: Donald CarnesJames R Nouri DOB: Apr 11, 1956 MRN: [id number]   Date of Service  09/18/2016  HPI/Events of Note  K+ = 6.4 and Creatinine = 3.20.   eICU Interventions  Will order: 1. Bolus with LR 1 liter IV over 1 hour now. 2. Place NGT. 3. KUB post NGT placement.  4. Kayexalate 30 gm via tube post NGT placement.  5. BMP at 1 AM.      Intervention Category Major Interventions: Electrolyte abnormality - evaluation and management  Lenell AntuSommer,Kacen Mellinger Eugene 09/18/2016, 8:26 PM

## 2016-09-18 NOTE — Anesthesia Postprocedure Evaluation (Signed)
Anesthesia Post Note  Patient: Donald Romero  Procedure(s) Performed: Procedure(s) (LRB): Lumbar two-Sacrum one  Laminectomy for decompression with Thoracic ten  to pelvis fixation/fusion with Mazor robot (N/A) APPLICATION OF ROBOTIC ASSISTANCE FOR SPINAL PROCEDURE (N/A)  Patient location during evaluation: SICU Anesthesia Type: General Level of consciousness: patient remains intubated per anesthesia plan Vital Signs Assessment: post-procedure vital signs reviewed and stable Respiratory status: patient remains intubated per anesthesia plan Cardiovascular status: stable Anesthetic complications: no       Last Vitals:  Vitals:   09/18/16 1700 09/18/16 1800  BP: 114/69 108/71  Pulse: (!) 115 (!) 113  Resp: 18 15  Temp: 37.1 C     Last Pain:  Vitals:   09/18/16 1700  TempSrc: Axillary  PainSc:                  Noemi Bellissimo

## 2016-09-18 NOTE — Progress Notes (Signed)
CRITICAL VALUE ALERT  Critical value received:  K - 6.4  Date of notification:  09/18/16  Time of notification:  2000  MD notified (1st page):  Arsenio LoaderSommer, MD  Time of first page: 2015, responded at 2025  Orders received.

## 2016-09-18 NOTE — Assessment & Plan Note (Signed)
Post op respiratory failure following prolonged surgery  Plan Full vent support Diprivan for sedation fent prn Reasses for extubation 09/19/16

## 2016-09-18 NOTE — Brief Op Note (Signed)
09/18/2016  4:28 PM  PATIENT:  Donald Romero  61 y.o. male  PRE-OPERATIVE DIAGNOSIS:  Idiopathic scoliosis   POST-OPERATIVE DIAGNOSIS:  Idiopathic scoliosis   PROCEDURE:  Procedure(s): Lumbar two-Sacrum one  Laminectomy for decompression with Thoracic ten  to pelvis fixation/fusion with Mazor robot (N/A) APPLICATION OF ROBOTIC ASSISTANCE FOR SPINAL PROCEDURE (N/A)  SURGEON:  Surgeon(s) and Role:    * Truitt MerleBenjamin Jared Ditty, MD - Primary    * Julio SicksHenry Pool, MD - Assisting  PHYSICIAN ASSISTANT: Cindra PresumeVincent Costella, PA-C  ASSISTANTS: Above  ANESTHESIA:   local and general  EBL:  Total I/O In: 4250 [I.V.:3100; Blood:400; IV Piggyback:750] Out: 1210 [Urine:210; Blood:1000]  BLOOD ADMINISTERED: Cell saver  DRAINS: Medium hemovac   LOCAL MEDICATIONS USED:  MARCAINE    and LIDOCAINE   SPECIMEN:  No Specimen  DISPOSITION OF SPECIMEN:  N/A  COUNTS:  YES  TOURNIQUET:  * No tourniquets in log *  DICTATION: .Dragon Dictation  PLAN OF CARE: Admit to inpatient   PATIENT DISPOSITION:  ICU - extubated and stable.   Delay start of Pharmacological VTE agent (>24hrs) due to surgical blood loss or risk of bleeding: yes

## 2016-09-18 NOTE — Progress Notes (Signed)
eLink Physician-Brief Progress Note Patient Name: Donald CarnesJames R Mula DOB: 1955-08-12 MRN: [id number]   Date of Service  09/18/2016  HPI/Events of Note  Hypotension - requiring holding of Propofol IV infusion. Suspect that he will prove to be under volume resuscitated and anemic and 2. ABG on 40%/PRVC 14/TV 450/P 5 = 7.22/47.7/60.2. My orders clearly stated that he should be on a 600 mL TV which should have produced a pCO2 = 37 and pH > 7.30.   eICU Interventions  Will order: 1. Bolus with LR 500 mL IV over 30 minutes now. 2. Increase TV to ordered 600 mL. 3. ABG at 11 PM.  4. Versed 1 mg IV Q 2 hours PRN sedation.         [name] [name] 09/18/2016, 9:50 PM

## 2016-09-18 NOTE — Progress Notes (Signed)
eLink Physician-Brief Progress Note Patient Name: Donald CarnesJames R Romero DOB: Aug 13, 1955 MRN: [id number]   Date of Service  09/18/2016  HPI/Events of Note  61 yo male arrives in ICU intubated and ventilated s/p lengthy spine surgery.  BP = 119/76 and HR = 114. Sat = 100% on 40%/OS 10/P 5. Neurosurgeon did not call eLink, however, I understand from the nurse that they wanted to patient on the ventilator post procedure d/t the duration of the surgery and the fact that the patient was prone for the procedure. Will leave the patient intubated and ventilated overnight. Patient is already on SCD's and Protonix IV.   eICU Interventions  Will order: 1. Ventilator Orders: 40%/PRVC 14/TV 600/P 5. 2. ABG at 6:45 PM.     Intervention Category Major Interventions: Respiratory failure - evaluation and management  Tameem Pullara Eugene 09/18/2016, 5:40 PM

## 2016-09-18 NOTE — Progress Notes (Signed)
Vent mode changed to PRVC rate of 14 BPM per Dr. Levada SchillingSummers.

## 2016-09-18 NOTE — Consult Note (Signed)
PULMONARY / CRITICAL CARE MEDICINE   Name: Donald Romero MRN: [id number] DOB: [birth year]    ADMISSION DATE:  09/18/2016 CONSULTATION DATE:  09/18/2016   REFERRING MD:  Dr Ditty  CHIEF COMPLAINT:  Post op respiratory failure  HISTORY OF PRESENT ILLNESS:   61 yearo old idiopathic scoliosis T2l2 fusion at age 61. Presented with progressive worsening leg and back pain cw/ radiiculopathy. S/p T10-pelivs fixatin and fusion with L2-S1 decompression. S/p 10h surgery prone per RN. Now post op asked to be left intubated. He is awakening and moving all 4s. PCCM asdked to consult for post op resp failure needing vent. Girl friend at bedside. Noted to be in sinus tachycardia HR 114/min  PAST MEDICAL HISTORY :  He  has a past medical history of Arthritis; Gout; and Hypertension.  PAST SURGICAL HISTORY: He  has a past surgical history that includes Back surgery; Wrist surgery (Right); Colonoscopy; and Tonsillectomy.  Allergies  Allergen Reactions  . No Known Allergies     No current facility-administered medications on file prior to encounter.    Current Outpatient Prescriptions on File Prior to Encounter  Medication Sig  . indomethacin (INDOCIN) 25 MG capsule Take 25 mg by mouth 3 (three) times daily as needed (gout flare ups).     FAMILY HISTORY:  His has no family status information on file.    SOCIAL HISTORY: He  reports that he has been smoking.  He has been smoking about 0.50 packs per day. He has never used smokeless tobacco. He reports that he does not drink alcohol or use drugs.   VITAL SIGNS: BP 108/71   Pulse (!) 113   Temp 98.8 F (37.1 C) (Axillary)   Resp 15   Wt 74.8 kg (165 lb)   SpO2 100%   BMI 30.18 kg/m   HEMODYNAMICS:    VENTILATOR SETTINGS: Vent Mode: PRVC FiO2 (%):  [40 %] 40 % Set Rate:  [14 bmp] 14 bmp Vt Set:  [450 mL] 450 mL PEEP:  [5 cmH20] 5 cmH20 Pressure Support:  [10 cmH20] 10 cmH20 Plateau Pressure:  [12 cmH20] 12 cmH20  INTAKE /  OUTPUT: No intake/output data recorded.  PHYSICAL EXAMINATION:   General Appearance:    Looks criticall ill OBESE - yes  Head:    Normocephalic, without obvious abnormality, atraumatic  Eyes:    PERRL - equal, conjunctiva/corneas - clear      Ears:    Normal external ear canals, both ears  Nose:   NG tube - none  Throat:  ETT TUBE - yes , OG tube - ?  Neck:   Supple,  No enlargement/tenderness/nodules     Lungs:     Clear to auscultation bilaterally, Ventilator   Synchrony - yes  Chest wall:    No deformity  Heart:    S1 and S2 normal, no murmur, CVP - no.  Pressors - no. TACHYCARDIC +  Abdomen:     Soft, no masses, no organomegaly  Genitalia:    Not done  Rectal:   not done  Extremities:   Extremities- moving all 4s     Skin:   Intact in exposed areas . Sacral area - long  Back incision per RN. Drainage tubes without active bleed     Neurologic:   Sedation - s/p anestheiaa -> RASS --2 . Moves all 4s - yes. CAM-ICU - reports being in pain . Orientation - not assessed      LABS:  BMET No results for input(s):  NA, K, CL, CO2, BUN, CREATININE, GLUCOSE in the last 168 hours.  Electrolytes No results for input(s): CALCIUM, MG, PHOS in the last 168 hours.  CBC No results for input(s): WBC, HGB, HCT, PLT in the last 168 hours.  Coag's No results for input(s): APTT, INR in the last 168 hours.  Sepsis Markers No results for input(s): LATICACIDVEN, PROCALCITON, O2SATVEN in the last 168 hours.  ABG No results for input(s): PHART, PCO2ART, PO2ART in the last 168 hours.  Liver Enzymes No results for input(s): AST, ALT, ALKPHOS, BILITOT, ALBUMIN in the last 168 hours.  Cardiac Enzymes No results for input(s): TROPONINI, PROBNP in the last 168 hours.  Glucose No results for input(s): GLUCAP in the last 168 hours.  Imaging Dg Thoracolumabar Spine  Result Date: 09/18/2016 CLINICAL DATA:  Thoracolumbar spinal fixation EXAM: DG C-ARM 61-120 MIN; THORACOLUMBAR SPINE - 2 VIEW  COMPARISON:  CT thoracic spine 09/18/2016 FINDINGS: Fluoroscopic images were unchanged during placement of bilateral thoracolumbar spinal rods and transpedicular screws. No immediate hardware complication. IMPRESSION: Intraoperative fluoroscopy during posterior spinal fixation. Electronically Signed   By: Deatra Robinson M.D.   On: 09/18/2016 17:11   Ct Thoracic Spine Wo Contrast  Result Date: 09/18/2016 CLINICAL DATA:  Pre-surgical planning protocol prior to spinal instrumentation. EXAM: CT THORACIC AND LUMBAR SPINE WITHOUT CONTRAST TECHNIQUE: Multidetector CT imaging of the thoracic and lumbar spine was performed without contrast. Multiplanar CT image reconstructions were also generated. COMPARISON:  04/24/2016 FINDINGS: CT THORACIC SPINE FINDINGS There are 12 rib bearing thoracic vertebrae. There is upper thoracic curvature convex to the left and lower thoracic/thoracolumbar curvature convex to the right. There has been previous spinal fusion with a single posterior rod with laminar hooks at T4 and L2. There is not fusion at T1-2 or T2-3 and there are mild posterior element degenerative changes. There is solid posterior fusion beginning at the T3 posterior elements with fusion being solid all the way through L2. There are some ordinary costovertebral osteoarthritic changes in the thoracic spine, not likely significant. CT LUMBAR SPINE FINDINGS Thoracolumbar fusion is solid extending down through the L2 level with there is a laminar broke and posterior rod. At L2-3, there is some facet osteoarthritis. No significant stenosis was demonstrated at previous myelography. At L3-4 and L4-5, there is advanced disc degeneration with vacuum phenomenon and sclerotic change of the L3, L4 and L5 vertebral bodies. The L5-S1 disc does not show degeneration but there is L5-S1 facet arthropathy. Stenosis is present at the L3-4, L4-5 and L5-S1 levels as shown at previous myelography. IMPRESSION: Pre-surgical planning protocol.  Thoracolumbar curvature convex to the left in the upper thoracic region, to the right in the thoracolumbar region and to the left in the lower lumbar region. Previous ride fusion with laminar hypoxic at T4 and L2. Solid fusion from T3 through L2. 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 Posterior element hypertrophy. Spinal stenosis at L3-4, L4-5 and L5-S1 as shown at previous myelography. Electronically Signed   By: Paulina Fusi M.D.   On: 09/18/2016 08:19   Ct Lumbar Spine Wo Contrast  Result Date: 09/18/2016 CLINICAL DATA:  Pre-surgical planning protocol prior to spinal instrumentation. EXAM: CT THORACIC AND LUMBAR SPINE WITHOUT CONTRAST TECHNIQUE: Multidetector CT imaging of the thoracic and lumbar spine was performed without contrast. Multiplanar CT image reconstructions were also generated. COMPARISON:  04/24/2016 FINDINGS: CT THORACIC SPINE FINDINGS There are 12 rib bearing thoracic vertebrae. There is upper thoracic curvature  convex to the left and lower thoracic/thoracolumbar curvature convex to the right. There has been previous spinal fusion with a single posterior rod with laminar hooks at T4 and L2. There is not fusion at T1-2 or T2-3 and there are mild posterior element degenerative changes. There is solid posterior fusion beginning at the T3 posterior elements with fusion being solid all the way through L2. There are some ordinary costovertebral osteoarthritic changes in the thoracic spine, not likely significant. CT LUMBAR SPINE FINDINGS Thoracolumbar fusion is solid extending down through the L2 level with there is a laminar broke and posterior rod. At L2-3, there is some facet osteoarthritis. No significant stenosis was demonstrated at previous myelography. At L3-4 and L4-5, there is advanced disc degeneration with vacuum phenomenon and sclerotic change of the L3, L4 and L5 vertebral bodies. The L5-S1 disc does not show  degeneration but there is L5-S1 facet arthropathy. Stenosis is present at the L3-4, L4-5 and L5-S1 levels as shown at previous myelography. IMPRESSION: Pre-surgical planning protocol. Thoracolumbar curvature convex to the left in the upper thoracic region, to the right in the thoracolumbar region and to the left in the lower lumbar region. Previous ride fusion with laminar hypoxic at T4 and L2. Solid fusion from T3 through L2. 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 Posterior element hypertrophy. Spinal stenosis at L3-4, L4-5 and L5-S1 as shown at previous myelography. Electronically Signed   By: Paulina FusiMark  Shogry M.D.   On: 09/18/2016 08:19   Dg Chest Port 1 View  Result Date: 09/18/2016 CLINICAL DATA:  Endotracheal tube placement. Acute respiratory failure. Spine surgery today. EXAM: PORTABLE CHEST 1 VIEW COMPARISON:  09/18/2016 CT FINDINGS: Thoracic spine CT from 09/18/2016. Endotracheal tube tip is 2.5 cm above the carina. Suspected mild atelectasis medially in both lower lobes. The lungs appear otherwise clear. Thoracolumbar rod fixator with laminar hooks, and lower thoracic and lumbar spine posterolateral rods with pedicle screw fixators. IMPRESSION: 1. Endotracheal tube tip: 2.5 cm above the carina, satisfactorily position. 2. Mild bibasilar atelectasis. Electronically Signed   By: Gaylyn RongWalter  Liebkemann M.D.   On: 09/18/2016 17:58   Dg C-arm 1-60 Min  Result Date: 09/18/2016 CLINICAL DATA:  Thoracolumbar spinal fixation EXAM: DG C-ARM 61-120 MIN; THORACOLUMBAR SPINE - 2 VIEW COMPARISON:  CT thoracic spine 09/18/2016 FINDINGS: Fluoroscopic images were unchanged during placement of bilateral thoracolumbar spinal rods and transpedicular screws. No immediate hardware complication. IMPRESSION: Intraoperative fluoroscopy during posterior spinal fixation. Electronically Signed   By: Deatra RobinsonKevin  Herman M.D.   On: 09/18/2016 17:11    ASSESSMENT /  PLAN: ASSESSMENT and PLAN  Postoperative respiratory failure (HCC) Post op respiratory failure following prolonged surgery  Plan Full vent support Diprivan for sedation fent prn Reasses for extubation 09/19/16   Sinus tachycardia Probably pain and post op volume relaated  Plan Pain control Fluid bolus and 100cc/h LR infusion  Acute post-operative pain fent prn  Goals of care, counseling/discussion Girl friend updated at bedside      FAMILY  -   - Inter-disciplinary family meet or Palliative Care meeting due by:  DAy 7. Current LOS is LOS 0 days  CODE STATUS    Code Status Orders        Start     Ordered   09/18/16 1633  Full code  Continuous     09/18/16 1632    Code Status History    Date Active Date Inactive Code Status Order ID Comments User  Context   04/24/2016  2:58 PM 04/25/2016  3:17 AM Full Code [id number]  Sebastian Ache, MD HOV        DISPO Keep in ICU      The patient is critically ill with multiple organ systems failure and requires high complexity decision making for assessment and support, frequent evaluation and titration of therapies, application of advanced monitoring technologies and extensive interpretation of multiple databases.   Critical Care Time devoted to patient care services described in this note is  30  Minutes. This time reflects time of care of this signee Dr Kalman Shan. This critical care time does not reflect procedure time, or teaching time or supervisory time of PA/NP/Med student/Med Resident etc but could involve care discussion time    Dr. Kalman Shan, M.D., [facility].C.P Pulmonary and Critical Care Medicine Staff Physician [facility] [place] [facility] Pager: [phone], If no answer or between  15:00h - 7:00h: call [phone]  09/18/2016 6:12 PM

## 2016-09-18 NOTE — Anesthesia Procedure Notes (Signed)
Procedure Name: Intubation Date/Time: 09/18/2016 8:18 AM Performed by: Ollen Bowl Pre-anesthesia Checklist: Patient identified, Emergency Drugs available, Suction available, Patient being monitored and Timeout performed Patient Re-evaluated:Patient Re-evaluated prior to inductionOxygen Delivery Method: Circle system utilized and Simple face mask Preoxygenation: Pre-oxygenation with 100% oxygen Intubation Type: IV induction Ventilation: Mask ventilation without difficulty and Oral airway inserted - appropriate to patient size Laryngoscope Size: Mac and 4 Grade View: Grade I Tube type: Oral Tube size: 7.5 mm Number of attempts: 1 Airway Equipment and Method: Patient positioned with wedge pillow and Stylet Placement Confirmation: ETT inserted through vocal cords under direct vision,  positive ETCO2 and breath sounds checked- equal and bilateral Secured at: 21 cm Tube secured with: Tape Dental Injury: Teeth and Oropharynx as per pre-operative assessment

## 2016-09-19 ENCOUNTER — Inpatient Hospital Stay (HOSPITAL_COMMUNITY): Payer: 59

## 2016-09-19 DIAGNOSIS — J9601 Acute respiratory failure with hypoxia: Secondary | ICD-10-CM

## 2016-09-19 LAB — BLOOD GAS, ARTERIAL
Acid-base deficit: 4.2 mmol/L — ABNORMAL HIGH (ref 0.0–2.0)
BICARBONATE: 21.3 mmol/L (ref 20.0–28.0)
Drawn by: 404151
FIO2: 40
LHR: 14 {breaths}/min
O2 SAT: 97.9 %
PATIENT TEMPERATURE: 98.6
PEEP: 5 cmH2O
VT: 600 mL
pCO2 arterial: 45.6 mmHg (ref 32.0–48.0)
pH, Arterial: 7.292 — ABNORMAL LOW (ref 7.350–7.450)
pO2, Arterial: 137 mmHg — ABNORMAL HIGH (ref 83.0–108.0)

## 2016-09-19 LAB — PROTIME-INR
INR: 1.13
Prothrombin Time: 14.6 seconds (ref 11.4–15.2)

## 2016-09-19 LAB — CBC
HCT: 25 % — ABNORMAL LOW (ref 39.0–52.0)
HEMOGLOBIN: 8.5 g/dL — AB (ref 13.0–17.0)
MCH: 29.7 pg (ref 26.0–34.0)
MCHC: 34 g/dL (ref 30.0–36.0)
MCV: 87.4 fL (ref 78.0–100.0)
Platelets: 114 10*3/uL — ABNORMAL LOW (ref 150–400)
RBC: 2.86 MIL/uL — AB (ref 4.22–5.81)
RDW: 13.8 % (ref 11.5–15.5)
WBC: 18.4 10*3/uL — ABNORMAL HIGH (ref 4.0–10.5)

## 2016-09-19 LAB — PHOSPHORUS: Phosphorus: 3.9 mg/dL (ref 2.5–4.6)

## 2016-09-19 LAB — BASIC METABOLIC PANEL
ANION GAP: 7 (ref 5–15)
BUN: 34 mg/dL — ABNORMAL HIGH (ref 6–20)
CALCIUM: 8 mg/dL — AB (ref 8.9–10.3)
CO2: 24 mmol/L (ref 22–32)
Chloride: 103 mmol/L (ref 101–111)
Creatinine, Ser: 3.4 mg/dL — ABNORMAL HIGH (ref 0.61–1.24)
GFR, EST AFRICAN AMERICAN: 21 mL/min — AB (ref >60)
GFR, EST NON AFRICAN AMERICAN: 18 mL/min — AB (ref >60)
Glucose, Bld: 115 mg/dL — ABNORMAL HIGH (ref 65–99)
POTASSIUM: 5.6 mmol/L — AB (ref 3.5–5.1)
SODIUM: 134 mmol/L — AB (ref 135–145)

## 2016-09-19 LAB — POCT I-STAT 4, (NA,K, GLUC, HGB,HCT)
GLUCOSE: 124 mg/dL — AB (ref 65–99)
GLUCOSE: 161 mg/dL — AB (ref 65–99)
HEMATOCRIT: 36 % — AB (ref 39.0–52.0)
HEMATOCRIT: 29 % — AB (ref 39.0–52.0)
Hemoglobin: 12.2 g/dL — ABNORMAL LOW (ref 13.0–17.0)
Hemoglobin: 9.9 g/dL — ABNORMAL LOW (ref 13.0–17.0)
POTASSIUM: 4.1 mmol/L (ref 3.5–5.1)
POTASSIUM: 4.6 mmol/L (ref 3.5–5.1)
SODIUM: 137 mmol/L (ref 135–145)
SODIUM: 137 mmol/L (ref 135–145)

## 2016-09-19 LAB — MAGNESIUM: Magnesium: 1.7 mg/dL (ref 1.7–2.4)

## 2016-09-19 LAB — APTT: APTT: 26 s (ref 24–36)

## 2016-09-19 MED ORDER — FENTANYL CITRATE (PF) 100 MCG/2ML IJ SOLN
50.0000 ug | INTRAMUSCULAR | Status: DC | PRN
  Administered 2016-09-19 (×2): 50 ug via INTRAVENOUS
  Administered 2016-09-19: 100 ug via INTRAVENOUS
  Administered 2016-09-20: 50 ug via INTRAVENOUS
  Filled 2016-09-19 (×4): qty 2

## 2016-09-19 MED ORDER — FAMOTIDINE 20 MG PO TABS
20.0000 mg | ORAL_TABLET | Freq: Every day | ORAL | Status: DC | PRN
  Administered 2016-09-19: 20 mg via ORAL
  Filled 2016-09-19: qty 1

## 2016-09-19 MED FILL — Sodium Chloride IV Soln 0.9%: INTRAVENOUS | Qty: 2000 | Status: AC

## 2016-09-19 MED FILL — Heparin Sodium (Porcine) Inj 1000 Unit/ML: INTRAMUSCULAR | Qty: 30 | Status: AC

## 2016-09-19 NOTE — Progress Notes (Signed)
eLink Physician-Brief Progress Note Patient Name: Donald CarnesJames R Ganson DOB: 1956-04-12 MRN: [id number]   Date of Service  09/19/2016  HPI/Events of Note  Patient c/o heartburn. No relief with Mylanta. Already on Protonix IV. Takes Zantac PO PRN at home.   eICU Interventions  Will order: 1. Zantac 20 mg PO Q 12 hours PRN heartburn.      Intervention Category Intermediate Interventions: Other:  Lenell AntuSommer,Ariahna Smiddy Eugene 09/19/2016, 6:34 PM

## 2016-09-19 NOTE — Care Management Note (Signed)
Case Management Note  Patient Details  Name: Donald Romero MRN: [id number] Date of Birth: 1956-04-06  Subjective/Objective:   Pt admitted on 09/18/16 s/p T10 to pelvis fixation/fusion with L2-S1 decompression.  PTA, pt resides at home with roommate.                  Action/Plan: PT recommending CIR; OT consult pending.  Will follow for discharge planning as pt progresses.  Expected Discharge Date:                  Expected Discharge Plan:  IP Rehab Facility  In-House Referral:     Discharge planning Services  CM Consult  Post Acute Care Choice:    Choice offered to:     DME Arranged:    DME Agency:     HH Arranged:    HH Agency:     Status of Service:  In process, will continue to follow  If discussed at Long Length of Stay Meetings, dates discussed:    Additional Comments:  Quintella BatonJulie W. Zakiah Gauthreaux, RN, BSN  Trauma/Neuro ICU Case Manager [phone]

## 2016-09-19 NOTE — Progress Notes (Signed)
OT Cancellation Note  Patient Details Name: Quincy CarnesJames R Wishon MRN: [id number] DOB: Dec 24, 1955   Cancelled Treatment:    Reason Eval/Treat Not Completed: Patient not medically ready.  Pt currently intubated.  Khalea Ventura Algiersonarpe, OTR/L [phone]   Jeani HawkingConarpe, Reggie Bise M 09/19/2016, 10:05 AM

## 2016-09-19 NOTE — Progress Notes (Signed)
Orthopedic Tech Progress Note Patient Details:  Donald Romero Aug 21, 1955 [id number]  Patient ID: Donald Romero, male   DOB: Aug 21, 1955, 61 y.o.   MRN: [id number]   Nikki DomCrawford, Danyl Deems 09/19/2016, 9:17 AM  Called in bio-tech brace order; [facility]

## 2016-09-19 NOTE — Progress Notes (Signed)
PULMONARY / CRITICAL CARE MEDICINE   Name: Donald Romero MRN: [id number] DOB: 04-07-1956    ADMISSION DATE:  09/18/2016 CONSULTATION DATE:  09/19/16  REFERRING MD:  Dr. Bevely Palmeritty   CHIEF COMPLAINT:  Post-operative respiratory failure   BRIEF SUMMARY: 61 y/o Romero with scoliosis s/p fusion (at age 61) and recent progressive leg/back pain with radiculopathy admitted 3/12 for L2-Sq laminectomy for decompression with T10-pelvis fixation/fusion.  OR time prone approximately 10 hours.  Patient was left intubated and returned to ICU.    SUBJECTIVE:  Pt anxious to have ETT out.  Family at bedside.    VITAL SIGNS: BP 111/72   Pulse (!) 116   Temp 97.8 F (36.6 C) (Axillary)   Resp 12   Wt 165 lb (74.8 kg)   SpO2 100%   BMI 30.18 kg/Romero   HEMODYNAMICS:    VENTILATOR SETTINGS: Vent Mode: PRVC FiO2 (%):  [40 %] 40 % Set Rate:  [14 bmp] 14 bmp Vt Set:  [450 mL-600 mL] 600 mL PEEP:  [5 cmH20] 5 cmH20 Pressure Support:  [10 cmH20] 10 cmH20 Plateau Pressure:  [12 cmH20-22 cmH20] 22 cmH20  INTAKE / OUTPUT: I/O last 3 completed shifts: In: 8157.5 [I.V.:4807.5; Blood:400; IV Piggyback:2950] Out: 2075 [Urine:525; Drains:550; Blood:1000]  PHYSICAL EXAMINATION: General: adult male in NAD on vent  HEENT: MM pink/moist, ETT Neuro: awake/alert, follows commands appropriately, MAE CV: s1s2 rrr, no Romero/r/g PULM: even/non-labored, lungs bilaterally clear NW:GNFAGI:soft, non-tender, bsx4 active  Extremities: warm/dry, trace generalized edema  Skin: no rashes or lesions   LABS:  BMET  Recent Labs Lab 09/18/16 0908 09/18/16 1834 09/19/16 0318  NA 137 135 134*  K 4.1 6.4* 5.6*  CL  --  104 103  CO2  --  19* 24  BUN  --  31* 34*  CREATININE  --  3.20* 3.40*  GLUCOSE 124* 239* 115*    Electrolytes  Recent Labs Lab 09/18/16 1834 09/19/16 0318  CALCIUM 7.9* 8.0*  MG  --  1.7  PHOS  --  3.9    CBC  Recent Labs Lab 09/18/16 0908 09/18/16 1834 09/19/16 0318  WBC  --  15.3* 18.4*  HGB  12.2* 9.6* 8.5*  HCT 36.0* 29.3* 25.0*  PLT  --  152 114*    Coag's  Recent Labs Lab 09/19/16 0318  APTT 26  INR 1.13    Sepsis Markers No results for input(s): LATICACIDVEN, PROCALCITON, O2SATVEN in the last 168 hours.  ABG  Recent Labs Lab 09/18/16 2020 09/19/16 0012  PHART 7.223* 7.292*  PCO2ART 47.7 45.6  PO2ART 60.2* 137*    Liver Enzymes No results for input(s): AST, ALT, ALKPHOS, BILITOT, ALBUMIN in the last 168 hours.  Cardiac Enzymes No results for input(s): TROPONINI, PROBNP in the last 168 hours.  Glucose No results for input(s): GLUCAP in the last 168 hours.  Imaging Dg Thoracolumabar Spine  Result Date: 09/18/2016 CLINICAL DATA:  Thoracolumbar spinal fixation EXAM: DG C-ARM 61-120 MIN; THORACOLUMBAR SPINE - 2 VIEW COMPARISON:  CT thoracic spine 09/18/2016 FINDINGS: Fluoroscopic images were unchanged during placement of bilateral thoracolumbar spinal rods and transpedicular screws. No immediate hardware complication. IMPRESSION: Intraoperative fluoroscopy during posterior spinal fixation. Electronically Signed   By: Donald Romero  Herman Romero.D.   On: 09/18/2016 17:11   Dg Chest Port 1 View  Result Date: 09/18/2016 CLINICAL DATA:  Endotracheal tube placement. Acute respiratory failure. Spine surgery today. EXAM: PORTABLE CHEST 1 VIEW COMPARISON:  09/18/2016 CT FINDINGS: Thoracic spine CT from 09/18/2016. Endotracheal tube tip  is 2.5 cm above the carina. Suspected mild atelectasis medially in both lower lobes. The lungs appear otherwise clear. Thoracolumbar rod fixator with laminar hooks, and lower thoracic and lumbar spine posterolateral rods with pedicle screw fixators. IMPRESSION: 1. Endotracheal tube tip: 2.5 cm above the carina, satisfactorily position. 2. Mild bibasilar atelectasis. Electronically Signed   By: Donald Romero Romero.D.   On: 09/18/2016 17:58   Dg Abd Portable 1v  Result Date: 09/18/2016 CLINICAL DATA:  61 y/o  Romero; oral gastric tube placement. EXAM:  PORTABLE ABDOMEN - 1 VIEW COMPARISON:  None available. FINDINGS: Nonobstructive bowel gas pattern. Enteric tube tip projects over the mid stomach. Spinal fusion mass and scoliosis rods, partially visualize, without apparent hardware related complication. Severe thoracolumbar dextrocurvature. IMPRESSION: Enteric tube tip projects over the mid stomach. Nonobstructive bowel gas pattern. Electronically Signed   By: Donald Romero Romero.D.   On: 09/18/2016 22:50   Dg C-arm 1-60 Min  Result Date: 09/18/2016 CLINICAL DATA:  Thoracolumbar spinal fixation EXAM: DG C-ARM 61-120 MIN; THORACOLUMBAR SPINE - 2 VIEW COMPARISON:  CT thoracic spine 09/18/2016 FINDINGS: Fluoroscopic images were unchanged during placement of bilateral thoracolumbar spinal rods and transpedicular screws. No immediate hardware complication. IMPRESSION: Intraoperative fluoroscopy during posterior spinal fixation. Electronically Signed   By: Donald Robinson Romero.D.   On: 09/18/2016 17:11     STUDIES:  CT Lumbar / Thoracic Spine 3/12 >> curvature convex to the left in the upper thoracic region, curve to the right in the lower lumbar region, advanced disc degeneration at L3-5, spinal stenosis L3-S1  CULTURES:   ANTIBIOTICS: Ancef (surgery) 3/12  SIGNIFICANT EVENTS: 3/12  Admit for spinal fusion in setting of scoliosis   LINES/TUBES: ETT 3/12 >>  DISCUSSION: Donald Romero, smoker, admitted for fusion secondary to pain/radiculopathy in setting of scoliosis  ASSESSMENT / PLAN:  PULMONARY A: Post-Operative Respiratory Failure  Tobacco Abuse  P:   Now extubation Continue O2 as needed for sats 90-95% PRN albuterol given smoking hx  Pulmonary hygiene - IS, mobilize as able Smoking cessation  CARDIOVASCULAR A:  Sinus Tachycardia Hypotension - suspect related to propofol P:  ICU monitoring   RENAL A:   Acute Kidney Injury  Rhabdomyolysis  Hyperkalemia Hypomagnesemia  P:   Trend BMP / urinary output Replace  electrolytes as indicated Avoid nephrotoxic agents, ensure adequate renal perfusion Hold celebrex with AKI LR at 100 ml/hr   GASTROINTESTINAL A:   At Risk Protein Calorie Malnutrition  P:   NPO  PPI for SUP  Colace BID  HEMATOLOGIC A:   Anemia  Thrombocytopenia  P:  Trend CBC  Monitor for bleeding    INFECTIOUS A:   S/P Spinal Fusion  P:   Monitor surgical site, fever curve / WBC trend  ENDOCRINE A:   No acute issues  P:   Monitor glucose on BMP   NEUROLOGIC A:   Post-Operative Pain  P:    Gabapentin TID    FAMILY  - Updates: Family updated at bedside 3/13.    - Inter-disciplinary family meet or Palliative Care meeting due by:  3/18  NP CC Time: 30 minutes   Canary Brim, NP-C [place] [facility] Pgr: [phone] or if no answer [phone] 09/19/2016, 10:44 AM  Attending Note:  61 year old male s/p extensive spinal fusion due to scoliosis who developed rhabdo.  Patient is weaning very well on exam with clear lungs alert and following commands.  Ok to extubate per surgery.  Will proceed with extubation.  Active hydration given rhabdo.  Minimize sedating medications as able to avoid respiratory failure but do acknowledge that patient will have significant pain.  Labs ordered in AM, monitor I/O.  The patient is critically ill with multiple organ systems failure and requires high complexity decision making for assessment and support, frequent evaluation and titration of therapies, application of advanced monitoring technologies and extensive interpretation of multiple databases.   Critical Care Time devoted to patient care services described in this note is  35  Minutes. This time reflects time of care of this signee Dr Koren Bound. This critical care time does not reflect procedure time, or teaching time or supervisory time of PA/NP/Med student/Med Resident etc but could involve care discussion time.  Alyson Reedy, Romero.D. [facility]  Pulmonary/Critical Care Medicine. Pager: [phone]. After hours pager: [phone].

## 2016-09-19 NOTE — Evaluation (Signed)
Physical Therapy Evaluation Patient Details Name: Donald CarnesJames R Romero MRN: [id number] DOB: Nov 16, 1955 Today's Date: 09/19/2016   History of Present Illness  Pt is a 61 y.o. male with scoliosis s/p fusion (at age 61) and recent progressive leg/back pain with radiculopathy, admitted 09/18/16 for L2-S1 laminectomy for decompression with T10-pelvis fixation/fusion. Pertinent PMH includes HTN, arthritis, gout.   Clinical Impression  Pt presents with increased pain, tachycardia, and post-surgical deficits secondary to above. PTA, pt mod indep with SPC, living with roommate who is able to provide 24-7 supervision. Today, pt able to sit EOB, stand, and take a step with modA +2; limited by increased HR to 151 and pain with mobility. Pt motivated to work with PT, even with increased pain. Would benefit from continued acute PT services to maximize functional mobility and independence.     Follow Up Recommendations CIR    Equipment Recommendations  Other (comment) (Defer to next venue)    Recommendations for Other Services OT consult;Rehab consult     Precautions / Restrictions Precautions Precautions: Fall;Back Precaution Booklet Issued: No Required Braces or Orthoses: Spinal Brace Spinal Brace: Lumbar corset;Applied in sitting position Restrictions Weight Bearing Restrictions: No      Mobility  Bed Mobility Overal bed mobility: Needs Assistance Bed Mobility: Rolling;Sidelying to Sit;Sit to Sidelying Rolling: Mod assist Sidelying to sit: Mod assist     Sit to sidelying: Mod assist;HOB elevated General bed mobility comments: Educ and verbal cues for log roll technique. ModA for scooting hips forward to EOB; modA for maneuvering LEs from sit-to-sidelying  Transfers Overall transfer level: Needs assistance Equipment used: 2 person hand held assist Transfers: Sit to/from Stand Sit to Stand: Mod assist;+2 physical assistance         General transfer comment: Stood w/ modA +2, pt with  significant pain during standing.   Ambulation/Gait                Stairs            Wheelchair Mobility    Modified Rankin (Stroke Patients Only)       Balance Overall balance assessment: Needs assistance Sitting-balance support: No upper extremity supported;Feet supported;Feet unsupported   Sitting balance - Comments: Min-maxA for sitting balance and brace application. Pt with significant posterior lean requiring multimodal cues to maintain upright posture.  Postural control: Posterior lean Standing balance support: Bilateral upper extremity supported Standing balance-Leahy Scale: Poor                               Pertinent Vitals/Pain Pain Assessment: Faces Faces Pain Scale: Hurts whole lot Pain Location: Back Pain Descriptors / Indicators: Aching Pain Intervention(s): Monitored during session;Limited activity within patient's tolerance;Repositioned;Relaxation    Home Living Family/patient expects to be discharged to:: Private residence Living Arrangements: Non-relatives/Friends Available Help at Discharge: Friend(s);Available 24 hours/day ([place]) Type of Home: House Home Access: Stairs to enter   [employer] CorporationEntrance Stairs-Number of Steps: 1 Home Layout: One level Home Equipment: Cane - single point      Prior Function Level of Independence: Independent with assistive device(s)         Comments: SPC for amb secondary to pain. Drives.      Hand Dominance        Extremity/Trunk Assessment   Upper Extremity Assessment Upper Extremity Assessment: Overall WFL for tasks assessed    Lower Extremity Assessment Lower Extremity Assessment: Generalized weakness (Denies numbness/tingling)       Communication  Communication: No difficulties  Cognition Arousal/Alertness: Awake/alert Behavior During Therapy: WFL for tasks assessed/performed Overall Cognitive Status: Within Functional Limits for tasks assessed                       General Comments General comments (skin integrity, edema, etc.): Pt masking pain symptoms throughout session. BP 146/69 and HR 110s at rest. HR up to 145-151 with sitting and standing despite cues for relaxation/breathing technique; RN aware.     Exercises     Assessment/Plan    PT Assessment Patient needs continued PT services  PT Problem List Decreased strength;Decreased range of motion;Decreased mobility;Decreased activity tolerance;Decreased balance;Cardiopulmonary status limiting activity;Decreased knowledge of precautions;Pain       PT Treatment Interventions Gait training;Stair training;Therapeutic exercise;Patient/family education;Therapeutic activities;Balance training;Neuromuscular re-education;Functional mobility training    PT Goals (Current goals can be found in the Care Plan section)  Acute Rehab PT Goals Patient Stated Goal: Return home PT Goal Formulation: With patient Time For Goal Achievement: 10/03/16 Potential to Achieve Goals: Good    Frequency Min 5X/week   Barriers to discharge        Co-evaluation               End of Session Equipment Utilized During Treatment: Gait belt;Back brace Activity Tolerance: Patient limited by pain;Patient tolerated treatment well Patient left: in bed;with nursing/sitter in room;with call bell/phone within reach Nurse Communication: Mobility status PT Visit Diagnosis: Muscle weakness (generalized) (M62.81);Other abnormalities of gait and mobility (R26.89)         Time: 8119-1478 PT Time Calculation (min) (ACUTE ONLY): 19 min   Charges:   PT Evaluation $PT Eval Moderate Complexity: 1 Procedure     PT G Codes:       Dewayne Hatch, SPT [phone]  Ina Homes 09/19/2016, 3:55 PM

## 2016-09-19 NOTE — Progress Notes (Signed)
Pt seen and examined.  PCCM consulted as pt remained intubated after surgery Hyperkalemic, AKI and hypovolemic overnight. Currently being managed by PCCM. Awake but unable to answer questions due to intubation  EXAM: Temp:  [97.4 F (36.3 C)-98.8 F (37.1 C)] 98.2 F (36.8 C) (03/13 0400) Pulse Rate:  [95-124] 111 (03/13 0700) Resp:  [0-21] 0 (03/13 0700) BP: (75-156)/(37-99) 126/89 (03/13 0700) SpO2:  [96 %-100 %] 100 % (03/13 0700) FiO2 (%):  [40 %] 40 % (03/13 0426) Intake/Output      03/12 0701 - 03/13 0700 03/13 0701 - 03/14 0700   I.V. (mL/kg) 4807.5 (64.3)    Blood 400    IV Piggyback 2950    Total Intake(mL/kg) 8157.5 (109.1)    Urine (mL/kg/hr) 525 (0.3)    Drains 550 (0.3)    Blood 1000 (0.6)    Total Output 2075     Net +6082.5           Awake and alert. Intubated. Follows commands throughout BUE: 5/5 strength but exception of left tricep 4/5 BLE: 4+/5 RLE, 4/5 LLE  Impression/Plan: Stable. Continue current care Appreciative of PCCM

## 2016-09-19 NOTE — Procedures (Signed)
Extubation Procedure Note  Patient Details:   Name: Donald Romero DOB: 04-15-1956 MRN: [id number]   Airway Documentation:     Evaluation  O2 sats: stable throughout Complications: No apparent complications Patient did tolerate procedure well. Bilateral Breath Sounds: Clear   Yes. Extubated per Dr.'s orders to 3L [place].  No complications vital signs stable  Donald Romero V 09/19/2016, 11:52 AM

## 2016-09-20 ENCOUNTER — Encounter (HOSPITAL_COMMUNITY): Payer: Self-pay | Admitting: Physical Medicine and Rehabilitation

## 2016-09-20 ENCOUNTER — Inpatient Hospital Stay (HOSPITAL_COMMUNITY): Payer: 59

## 2016-09-20 DIAGNOSIS — N179 Acute kidney failure, unspecified: Secondary | ICD-10-CM

## 2016-09-20 DIAGNOSIS — M419 Scoliosis, unspecified: Secondary | ICD-10-CM

## 2016-09-20 DIAGNOSIS — N183 Chronic kidney disease, stage 3 (moderate): Secondary | ICD-10-CM

## 2016-09-20 DIAGNOSIS — M4125 Other idiopathic scoliosis, thoracolumbar region: Secondary | ICD-10-CM

## 2016-09-20 LAB — BASIC METABOLIC PANEL
ANION GAP: 6 (ref 5–15)
BUN: 43 mg/dL — AB (ref 6–20)
CHLORIDE: 105 mmol/L (ref 101–111)
CO2: 24 mmol/L (ref 22–32)
Calcium: 8.1 mg/dL — ABNORMAL LOW (ref 8.9–10.3)
Creatinine, Ser: 3.54 mg/dL — ABNORMAL HIGH (ref 0.61–1.24)
GFR calc Af Amer: 20 mL/min — ABNORMAL LOW (ref >60)
GFR, EST NON AFRICAN AMERICAN: 17 mL/min — AB (ref >60)
Glucose, Bld: 110 mg/dL — ABNORMAL HIGH (ref 65–99)
POTASSIUM: 4.5 mmol/L (ref 3.5–5.1)
SODIUM: 135 mmol/L (ref 135–145)

## 2016-09-20 LAB — CBC
HEMATOCRIT: 18.9 % — AB (ref 39.0–52.0)
HEMOGLOBIN: 6.3 g/dL — AB (ref 13.0–17.0)
MCH: 29.4 pg (ref 26.0–34.0)
MCHC: 33.3 g/dL (ref 30.0–36.0)
MCV: 88.3 fL (ref 78.0–100.0)
Platelets: 104 10*3/uL — ABNORMAL LOW (ref 150–400)
RBC: 2.14 MIL/uL — ABNORMAL LOW (ref 4.22–5.81)
RDW: 14.4 % (ref 11.5–15.5)
WBC: 12.6 10*3/uL — AB (ref 4.0–10.5)

## 2016-09-20 LAB — PREPARE RBC (CROSSMATCH)

## 2016-09-20 LAB — MAGNESIUM: Magnesium: 1.8 mg/dL (ref 1.7–2.4)

## 2016-09-20 LAB — PHOSPHORUS: PHOSPHORUS: 4.2 mg/dL (ref 2.5–4.6)

## 2016-09-20 LAB — HEMOGLOBIN AND HEMATOCRIT, BLOOD
HEMATOCRIT: 23.3 % — AB (ref 39.0–52.0)
HEMOGLOBIN: 7.9 g/dL — AB (ref 13.0–17.0)

## 2016-09-20 MED ORDER — CEFAZOLIN IN D5W 1 GM/50ML IV SOLN
1.0000 g | Freq: Three times a day (TID) | INTRAVENOUS | Status: DC
  Administered 2016-09-20 – 2016-09-21 (×4): 1 g via INTRAVENOUS
  Filled 2016-09-20 (×4): qty 50

## 2016-09-20 MED ORDER — SODIUM CHLORIDE 0.9 % IV SOLN: Freq: Once | INTRAVENOUS | Status: DC

## 2016-09-20 MED ORDER — COLCHICINE 0.6 MG PO TABS: 0.6000 mg | ORAL_TABLET | Freq: Two times a day (BID) | ORAL | Status: DC

## 2016-09-20 MED ORDER — COLCHICINE 0.6 MG PO TABS
1.2000 mg | ORAL_TABLET | Freq: Once | ORAL | Status: AC
  Administered 2016-09-20: 1.2 mg via ORAL
  Filled 2016-09-20: qty 2

## 2016-09-20 NOTE — Consult Note (Addendum)
Physical Medicine and Rehabilitation Consult   Reason for Consult: Scoliosis Referring Physician:  Dr. Bevely Palmer   HPI: Donald Romero is a 61 y.o. male with history of HTN, CKD stage III, idiopathic scoliosis treated with T2- L2 fusion at age 10 and now with progressive worsening of back and RLE> LLE pain. He was found to have scoliosis and lumbosacral spondylosis with radiculopathy and elected to undergo  T 10- pelvis fixation and fusion with  L2 to S1 decompression by Dr. Bevely Palmer. Patient with prolonged surgery with acute on chronic renal failure, hypotension, ABLA and need for prolonged intubation. PCCM consulted for assistance and patient tolerated extubation on 3/13. PT evaluation done yesterday and patient limited by pain, weakness and tachycardia. CIR recommended for follow up therapy.   Patient notes that he has had chronic right hip pain. In addition, he has pain in his left hand due to gout. He has chronic left knee swelling but not pain. He feels numbness in his feet. Review of Systems  HENT: Negative for hearing loss and tinnitus.   Eyes: Negative for blurred vision.  Respiratory: Negative for cough and shortness of breath.   Cardiovascular: Negative for chest pain, palpitations and leg swelling.  Gastrointestinal: Positive for heartburn. Negative for constipation and nausea.  Genitourinary: Negative for frequency and urgency.  Musculoskeletal: Positive for back pain, joint pain (right hip) and myalgias.       Left wrist pain due to gout flare  Neurological: Negative for dizziness, tingling, sensory change, focal weakness and headaches.  Psychiatric/Behavioral: The patient is not nervous/anxious and does not have insomnia.      Past Medical History:  Diagnosis Date  . Arthritis   . Gout   . Hypertension     Past Surgical History:  Procedure Laterality Date  . BACK SURGERY    . COLONOSCOPY    . TONSILLECTOMY    . WRIST SURGERY Right     Family History  Problem  Relation Age of Onset  . Hypertension Mother   . Lung disease Father     asbestosis  . 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 Independent without AD PTA. Has a roommate (male) who can assist after discharge. He reports that he has been smoking.  He has been smoking about 0.50 packs per day. He has never used smokeless tobacco. He reports that he does not drink alcohol or use drugs.    Allergies  Allergen Reactions  . No Known Allergies     Medications Prior to Admission  Medication Sig Dispense Refill  . Acetaminophen (TYLENOL ARTHRITIS PAIN PO) Take 650-1,300 mg by mouth every 8 (eight) hours as needed (for pain.).    Marland Kitchen hydrALAZINE (APRESOLINE) 100 MG tablet Take 100 mg by mouth 3 (three) times daily.    . indomethacin (INDOCIN) 25 MG capsule Take 25 mg by mouth 3 (three) times daily as needed (gout flare ups).       Home: Home Living Family/patient expects to be discharged to:: Private residence Living Arrangements: Non-relatives/Friends Available Help at Discharge: Friend(s), Available 24 hours/day ([place]) Type of Home: House Home Access: Stairs to enter [employer] of Steps: 1 Home Layout: One level Bathroom Shower/Tub: Tub/shower unit Home Equipment: Cane - single point  Functional History: Prior Function Level of Independence: Independent with assistive device(s) Comments: SPC for amb secondary to pain. Drives.  Functional Status:  Mobility: Bed Mobility Overal bed  mobility: Needs Assistance Bed Mobility: Rolling, Sidelying to Sit, Sit to Sidelying Rolling: Mod assist Sidelying to sit: Mod assist Sit to sidelying: Mod assist, HOB elevated General bed mobility comments: Educ and verbal cues for log roll technique. ModA for scooting hips forward to EOB; modA for maneuvering LEs from sit-to-sidelying Transfers Overall transfer level: Needs assistance Equipment used: 2 person hand held  assist Transfers: Sit to/from Stand Sit to Stand: Mod assist, +2 physical assistance General transfer comment: Stood w/ modA +2, pt with significant pain during standing.       ADL:    Cognition: Cognition Overall Cognitive Status: Within Functional Limits for tasks assessed Orientation Level: Oriented X4 Cognition Arousal/Alertness: Awake/alert Behavior During Therapy: WFL for tasks assessed/performed Overall Cognitive Status: Within Functional Limits for tasks assessed  Blood pressure 126/68, pulse (!) 114, temperature 98.6 F (37 C), temperature source Oral, resp. rate 11, height 5\' 2"  (1.575 m), weight 74 kg (163 lb 2.3 oz), SpO2 96 %. Physical Exam  Nursing note reviewed. Constitutional: He is oriented to person, place, and time. He appears well-developed and well-nourished.  HENT:  Head: Normocephalic and atraumatic.  Mouth/Throat: Oropharynx is clear and moist.  Eyes: Conjunctivae and EOM are normal. Pupils are equal, round, and reactive to light.  Neck: Normal range of motion. Neck supple.  Cardiovascular: Regular rhythm.  Tachycardia present.   Respiratory: Effort normal and breath sounds normal.  GI: Soft. Bowel sounds are normal. He exhibits no distension. There is no tenderness.  Musculoskeletal: He exhibits edema (1+ edema left hand with warmth and tenderness).  Neurological: He is alert and oriented to person, place, and time.  Skin: Skin is warm and dry.  Psychiatric: He has a normal mood and affect. His behavior is normal. Thought content normal.  Decreased sensation, left L5 dermatome, right L4, L5, S1 dermatomes Pain with right hip range of motion Pain with left finger and wrist range of motion. No pain with left elbow range of motion Motor strength is 5/5 in the right deltoid, biceps, triceps, grip, 5/5 left deltoid, bicep, tricep 3 minus, finger flexion, extension, wrist flexion and extension secondary to pain. 3 minus bilateral hip flexors 4 bilateral,  knee extensors 4 bilateral, ankle dorsiflexor, plantar flexor Results for orders placed or performed during the hospital encounter of 09/18/16 (from the past 24 hour(s))  Magnesium     Status: None   Collection Time: 09/20/16  3:29 AM  Result Value Ref Range   Magnesium 1.8 1.7 - 2.4 mg/dL  Phosphorus     Status: None   Collection Time: 09/20/16  3:29 AM  Result Value Ref Range   Phosphorus 4.2 2.5 - 4.6 mg/dL  Basic metabolic panel     Status: Abnormal   Collection Time: 09/20/16  3:29 AM  Result Value Ref Range   Sodium 135 135 - 145 mmol/L   Potassium 4.5 3.5 - 5.1 mmol/L   Chloride 105 101 - 111 mmol/L   CO2 24 22 - 32 mmol/L   Glucose, Bld 110 (H) 65 - 99 mg/dL   BUN 43 (H) 6 - 20 mg/dL   Creatinine, Ser 1.613.54 (H) 0.61 - 1.24 mg/dL   Calcium 8.1 (L) 8.9 - 10.3 mg/dL   GFR calc non Af Amer 17 (L) >60 mL/min   GFR calc Af Amer 20 (L) >60 mL/min   Anion gap 6 5 - 15  CBC     Status: Abnormal   Collection Time: 09/20/16  3:29 AM  Result Value Ref  Range   WBC 12.6 (H) 4.0 - 10.5 K/uL   RBC 2.14 (L) 4.22 - 5.81 MIL/uL   Hemoglobin 6.3 (LL) 13.0 - 17.0 g/dL   HCT 40.9 (L) 81.1 - 91.4 %   MCV 88.3 78.0 - 100.0 fL   MCH 29.4 26.0 - 34.0 pg   MCHC 33.3 30.0 - 36.0 g/dL   RDW 78.2 95.6 - 21.3 %   Platelets 104 (L) 150 - 400 K/uL  Type and screen  [facility]     Status: None   Collection Time: 09/20/16  5:29 AM  Result Value Ref Range   ABO/RH(D) B POS    Antibody Screen NEG    Sample Expiration 09/23/2016   Prepare RBC     Status: None   Collection Time: 09/20/16  5:29 AM  Result Value Ref Range   Order Confirmation ORDER PROCESSED BY BLOOD BANK    Dg Thoracolumabar Spine  Result Date: 09/18/2016 CLINICAL DATA:  Thoracolumbar spinal fixation EXAM: DG C-ARM 61-120 MIN; THORACOLUMBAR SPINE - 2 VIEW COMPARISON:  CT thoracic spine 09/18/2016 FINDINGS: Fluoroscopic images were unchanged during placement of bilateral thoracolumbar spinal rods and transpedicular  screws. No immediate hardware complication. IMPRESSION: Intraoperative fluoroscopy during posterior spinal fixation. Electronically Signed   By: Deatra Robinson M.D.   On: 09/18/2016 17:11   Dg Chest Port 1 View  Result Date: 09/20/2016 CLINICAL DATA:  Respiratory failure. EXAM: PORTABLE CHEST 1 VIEW COMPARISON:  09/19/2016 . FINDINGS: Endotracheal tube and NG tube in stable position. Cardiomegaly with normal pulmonary vascularity. Right base atelectatic changes noted on today's exam. Persistent left base subsegmental atelectasis and/or pleuroparenchymal scarring. Small left pleural effusion cannot be excluded . IMPRESSION: 1.  Right base atelectatic changes noted on today's exam. 2. Persistent left base subsegmental atelectasis and/or pleuroparenchymal scarring. Small left pleural effusion cannot be excluded . 2. Cardiomegaly.  No pulmonary venous congestion. Electronically Signed   By: Maisie Fus  Register   On: 09/20/2016 07:55   Dg Chest Port 1 View  Result Date: 09/19/2016 CLINICAL DATA:  Bilateral flank pain EXAM: PORTABLE CHEST 1 VIEW COMPARISON:  09/18/2016 FINDINGS: Cardiomegaly. NG tube is in the stomach. Endotracheal tube is unchanged. Left lower lobe atelectasis. Right lung is clear. No effusions. IMPRESSION: Left lower lobe atelectasis. Cardiomegaly. Electronically Signed   By: Charlett Nose M.D.   On: 09/19/2016 07:20   Dg Chest Port 1 View  Result Date: 09/18/2016 CLINICAL DATA:  Endotracheal tube placement. Acute respiratory failure. Spine surgery today. EXAM: PORTABLE CHEST 1 VIEW COMPARISON:  09/18/2016 CT FINDINGS: Thoracic spine CT from 09/18/2016. Endotracheal tube tip is 2.5 cm above the carina. Suspected mild atelectasis medially in both lower lobes. The lungs appear otherwise clear. Thoracolumbar rod fixator with laminar hooks, and lower thoracic and lumbar spine posterolateral rods with pedicle screw fixators. IMPRESSION: 1. Endotracheal tube tip: 2.5 cm above the carina, satisfactorily  position. 2. Mild bibasilar atelectasis. Electronically Signed   By: Gaylyn Rong M.D.   On: 09/18/2016 17:58   Dg Abd Portable 1v  Result Date: 09/18/2016 CLINICAL DATA:  61 y/o  M; oral gastric tube placement. EXAM: PORTABLE ABDOMEN - 1 VIEW COMPARISON:  None available. FINDINGS: Nonobstructive bowel gas pattern. Enteric tube tip projects over the mid stomach. Spinal fusion mass and scoliosis rods, partially visualize, without apparent hardware related complication. Severe thoracolumbar dextrocurvature. IMPRESSION: Enteric tube tip projects over the mid stomach. Nonobstructive bowel gas pattern. Electronically Signed   By: Mitzi Hansen M.D.   On: 09/18/2016  22:50   Dg C-arm 1-60 Min  Result Date: 09/18/2016 CLINICAL DATA:  Thoracolumbar spinal fixation EXAM: DG C-ARM 61-120 MIN; THORACOLUMBAR SPINE - 2 VIEW COMPARISON:  CT thoracic spine 09/18/2016 FINDINGS: Fluoroscopic images were unchanged during placement of bilateral thoracolumbar spinal rods and transpedicular screws. No immediate hardware complication. IMPRESSION: Intraoperative fluoroscopy during posterior spinal fixation. Electronically Signed   By: Deatra Robinson M.D.   On: 09/18/2016 17:11    Assessment/Plan: Diagnosis: Bilateral lower extremity radiculopathy secondary to lumbar spinal stenosis/scoliosis status post decompression and fusion 09/18/2016 1. Does the need for close, 24 hr/day medical supervision in concert with the patient's rehab needs make it unreasonable for this patient to be served in a less intensive setting? Yes 2. Co-Morbidities requiring supervision/potential complications: Right hip pain, probable away, left hand gout, severe postoperative anemia, acute on chronic renal failure 3. Due to bladder management, bowel management, safety, skin/wound care, disease management, medication administration, pain management and patient education, does the patient require 24 hr/day rehab nursing? Yes 4. Does the  patient require coordinated care of a physician, rehab nurse, PT (1-2 hrs/day, 5 days/week) and OT (1-2 hrs/day, 5 days/week) to address physical and functional deficits in the context of the above medical diagnosis(es)? Yes Addressing deficits in the following areas: balance, endurance, locomotion, strength, transferring, bowel/bladder control, bathing, dressing, feeding, grooming, toileting and psychosocial support 5. Can the patient actively participate in an intensive therapy program of at least 3 hrs of therapy per day at least 5 days per week? Potentially 6. The potential for patient to make measurable gains while on inpatient rehab is excellent 7. Anticipated functional outcomes upon discharge from inpatient rehab are modified independent and supervision  with PT, modified independent and supervision with OT, n/a with SLP. 8. Estimated rehab length of stay to reach the above functional goals is: 10-14d 9. Does the patient have adequate social supports and living environment to accommodate these discharge functional goals? Yes 10. Anticipated D/C setting: Home 11. Anticipated post D/C treatments: HH therapy 12. Overall Rehab/Functional Prognosis: excellent  RECOMMENDATIONS: This patient's condition is appropriate for continued rehabilitative care in the following setting: CIR Patient has agreed to participate in recommended program. Yes Note that insurance prior authorization may be required for reimbursement for recommended care.  Comment: May need another unit of PRBCs, given persistent tachycardia, may need nephrology evaluation. Acute on chronic renal failure Will need pain management on oral narcotics prior to rehabilitation admission   Jerene Pitch 09/20/2016

## 2016-09-20 NOTE — Progress Notes (Signed)
OT Cancellation Note  Patient Details Name: Donald Romero MRN: [id number] DOB: Jan 06, 1956   Cancelled Treatment:    Reason Eval/Treat Not Completed: Medical issues which prohibited therapy.  Hbg 6.3.  Yasmina [place], OTR/L [phone]   Jeani HawkingConarpe, Chrystine Frogge M 09/20/2016, 11:16 AM

## 2016-09-20 NOTE — Progress Notes (Signed)
Patient transferred to chair with walker, brace, and 2 assist for stand and pivot. Patient denies any dizziness.  MD notified patient's f/u hgb and hct is 7.9 and 23.3. Dr. Bevely Palmeritty ordered 1 unit rbc. RN to administer.

## 2016-09-20 NOTE — Progress Notes (Signed)
Pt seen and examined.  He was extubated yesterday. Hgb 6.3 0330. Given 1 unit PRBC. No other issues overnight He reports he feels well other than slightly fatigued. Tolerating po. Pain is well controlled.  Denies neurological symptoms.  EXAM: Temp:  [97.9 F (36.6 C)-98.8 F (37.1 C)] 98.6 F (37 C) (03/14 0800) Pulse Rate:  [114-139] 114 (03/14 0630) Resp:  [8-45] 11 (03/14 0630) BP: (87-146)/(49-79) 126/68 (03/14 0630) SpO2:  [92 %-100 %] 96 % (03/14 0630) Weight:  [74 kg (163 lb 2.3 oz)] 74 kg (163 lb 2.3 oz) (03/13 1600) Intake/Output      03/13 0701 - 03/14 0700 03/14 0701 - 03/15 0700   I.V. (mL/kg) 1777.9 (24)    Blood     IV Piggyback     Total Intake(mL/kg) 1777.9 (24)    Urine (mL/kg/hr) 775 (0.4)    Drains 535 (0.3)    Blood     Total Output 1310     Net +467.9           Awake and alert Follows commands throughout BUE: 5/5 strength but exception of left tricep 4/5 BLE: 4+/5 RLE, 4/5 LLE Wound dressing with dried blood.  Impression/Plan Appears well from a neurological stand point. Hgb 6.3 this morning. Was given 1 unit PRBC by PCCM. Cr continues to rise. Hyperkalemia has resolved. Okay from neurosurgical standpoint to transfer to floor if PCCM is okay with this as well.

## 2016-09-20 NOTE — Progress Notes (Signed)
PULMONARY / CRITICAL CARE MEDICINE   Name: Donald Romero MRN: [id number] DOB: June 27, 1956    ADMISSION DATE:  09/18/2016 CONSULTATION DATE:  09/19/16  REFERRING MD:  Dr. Bevely Palmer   CHIEF COMPLAINT:  Post-operative respiratory failure   BRIEF SUMMARY: 60 y/o M with scoliosis s/p fusion (at age 8) and recent progressive leg/back pain with radiculopathy admitted 3/12 for L2-Sq laminectomy for decompression with T10-pelvis fixation/fusion.  OR time prone approximately 10 hours.  Patient was left intubated and returned to ICU.  Extubated 3/13 without difficulty.  Post-op anemia 3/14 requiring transfusion.   SUBJECTIVE:  RN reports pt Hgb down to 6.3, transfused 1 unit PRBC.  Await post transfusion H/H.    VITAL SIGNS: BP 126/68   Pulse (!) 114   Temp 98.6 F (37 C) (Oral)   Resp 11   Ht 5\' 2"  (1.575 m)   Wt 163 lb 2.3 oz (74 kg)   SpO2 96%   BMI 29.84 kg/m   HEMODYNAMICS:    VENTILATOR SETTINGS:    INTAKE / OUTPUT: I/O last 3 completed shifts: In: 4185.4 [I.V.:2485.4; IV Piggyback:1700] Out: 1925 [Urine:990; Drains:935]  PHYSICAL EXAMINATION: General: well developed adult male in NAD HEENT: MM pink/moist Neuro: AAOx4, speech clear, MAE CV: s1s2 rrr, no m/r/g PULM: even/non-labored, lungs bilaterally clear XB:JYNW, non-tender, bsx4 active  Extremities: warm/dry, no edema  Skin: no rashes or lesions    LABS:  BMET  Recent Labs Lab 09/18/16 1834 09/19/16 0318 09/20/16 0329  NA 135 134* 135  K 6.4* 5.6* 4.5  CL 104 103 105  CO2 19* 24 24  BUN 31* 34* 43*  CREATININE 3.20* 3.40* 3.54*  GLUCOSE 239* 115* 110*    Electrolytes  Recent Labs Lab 09/18/16 1834 09/19/16 0318 09/20/16 0329  CALCIUM 7.9* 8.0* 8.1*  MG  --  1.7 1.8  PHOS  --  3.9 4.2    CBC  Recent Labs Lab 09/18/16 1834 09/19/16 0318 09/20/16 0329  WBC 15.3* 18.4* 12.6*  HGB 9.6* 8.5* 6.3*  HCT 29.3* 25.0* 18.9*  PLT 152 114* 104*    Coag's  Recent Labs Lab 09/19/16 0318   APTT 26  INR 1.13    Sepsis Markers No results for input(s): LATICACIDVEN, PROCALCITON, O2SATVEN in the last 168 hours.  ABG  Recent Labs Lab 09/18/16 2020 09/19/16 0012  PHART 7.223* 7.292*  PCO2ART 47.7 45.6  PO2ART 60.2* 137*    Liver Enzymes No results for input(s): AST, ALT, ALKPHOS, BILITOT, ALBUMIN in the last 168 hours.  Cardiac Enzymes No results for input(s): TROPONINI, PROBNP in the last 168 hours.  Glucose No results for input(s): GLUCAP in the last 168 hours.  Imaging Dg Chest Port 1 View  Result Date: 09/20/2016 CLINICAL DATA:  Respiratory failure. EXAM: PORTABLE CHEST 1 VIEW COMPARISON:  09/19/2016 . FINDINGS: Endotracheal tube and NG tube in stable position. Cardiomegaly with normal pulmonary vascularity. Right base atelectatic changes noted on today's exam. Persistent left base subsegmental atelectasis and/or pleuroparenchymal scarring. Small left pleural effusion cannot be excluded . IMPRESSION: 1.  Right base atelectatic changes noted on today's exam. 2. Persistent left base subsegmental atelectasis and/or pleuroparenchymal scarring. Small left pleural effusion cannot be excluded . 2. Cardiomegaly.  No pulmonary venous congestion. Electronically Signed   By: Maisie Fus  Register   On: 09/20/2016 07:55     STUDIES:  CT Lumbar / Thoracic Spine 3/12 >> curvature convex to the left in the upper thoracic region, curve to the right in the lower lumbar region,  advanced disc degeneration at L3-5, spinal stenosis L3-S1  CULTURES:   ANTIBIOTICS: Ancef (surgery) 3/12  SIGNIFICANT EVENTS: 3/12  Admit for spinal fusion in setting of scoliosis   LINES/TUBES: ETT 3/12 >> 3/13  DISCUSSION: 61 y/o M, smoker, admitted for fusion secondary to pain/radiculopathy in setting of scoliosis  ASSESSMENT / PLAN:  PULMONARY A: Post-Operative Respiratory Failure  Tobacco Abuse  P:   Pulmonary hygiene - IS, mobilize Smoking cessation   CARDIOVASCULAR A:  Sinus  Tachycardia Hypotension - suspect related to propofol P:  Ok to transfer out of ICU, defer to primary SVC   RENAL A:   Acute Kidney Injury  Rhabdomyolysis  Hyperkalemia Hypomagnesemia  P:   Trend BMP / urinary output Replace electrolytes as indicated Avoid nephrotoxic agents, ensure adequate renal perfusion Hold celebrex with AKI  Decrease LR to 50 ml/hr  GASTROINTESTINAL A:   At Risk Protein Calorie Malnutrition  P:   Advance diet as tolerated  Discontinue protonix  Colace BID  HEMATOLOGIC A:   Anemia  Thrombocytopenia  P:  Trend CBC  Monitor for bleeding  Transfuse for Hgb <7% or active bleeding    INFECTIOUS A:   S/P Spinal Fusion  P:   Monitor surgical site, fever curve / WBC trend   NEUROLOGIC A:   Post-Operative Pain  P:   Gabapentin TID  Pain regimen per primary SVC Caution with sedating medications  FAMILY  - Updates: Patient updated at bedside.   - Inter-disciplinary family meet or Palliative Care meeting due by:  3/18  - Global:  Ok to transfer out of ICU.  PCCM will sign off.  Please call back if new needs arise.   Canary BrimBrandi Ollis, NP-C [facility] Pgr: [phone] or if no answer [phone] 09/20/2016, 9:36 AM  Attending Note:  61 year old male s/p large back fusion who was intubated post op and extubated yesterday.  Patient is progressing and doing well.  On exam, lungs are clear and mental status is normal.  I reviewed CXR myself, no acute disease noted.  Discussed with PCCM-NP and bedside RN.  Respiratory failure: extubated and doing well  - Titrate O2 for sat of 88-92%  - Ambulate  Hypotension:  - Minimize narcotics  Post-op pain:  - Gabapentin  - Norco  Hemorrhagic anemia:  - Transfuse 1 unit pRBC  - CBC in AM  Rhabdo:  - CPK in AM  - Hydrate  Acute kidney injury  - Replace electrolytes  - BMET in AM  - Hydration with LR as above.  Ok to transfer out.  PCCM will sign off, please call back if  needed.  Patient seen and examined, agree with above note.  I dictated the care and orders written for this patient under my direction.  Donald ReedyWesam G Jakerra Floyd, MD [phone]

## 2016-09-20 NOTE — Progress Notes (Signed)
Acute blood loss anemia; received transfusion this morning Feels well except gout in left wrist Minimal pain Full strength in legs Continue drain Start colchicine for gout Therapy

## 2016-09-20 NOTE — Progress Notes (Signed)
PT Cancellation Note  Patient Details Name: Donald Romero MRN: [id number] DOB: 1956-07-01   Cancelled Treatment:     Patient with low Hgb 6.3 will hold at this time   Donald Romero 09/20/2016, 9:31 AM Donald Romero, PT DPT  [phone]

## 2016-09-20 NOTE — Progress Notes (Signed)
CRITICAL VALUE ALERT  Critical value received:  Hemoglobin 6.3  Date of notification:  09/20/16  Time of notification:  0441  Critical value read back: Yes  Nurse who received alert:  Denny PeonAvery, RN  MD notified (1st page):  E-link  Time of first page:  [phone]

## 2016-09-20 NOTE — Progress Notes (Signed)
Rehab Admissions Coordinator Note:  Patient was screened by Trish MageLogue, Danuta Huseman M for appropriateness for an Inpatient Acute Rehab Consult.  At this time, we are recommending Inpatient Rehab consult.  Trish MageLogue, Yailen Zemaitis M 09/20/2016, 8:16 AM  I can be reached at [phone].

## 2016-09-20 NOTE — Progress Notes (Signed)
eLink Physician-Brief Progress Note Patient Name: Donald CarnesJames R Shaler DOB: March 21, 1956 MRN: [id number]   Date of Service  09/20/2016  HPI/Events of Note  Hb=6.3  eICU Interventions  Will transfuse 1 unit prbc.      Intervention Category Intermediate Interventions: Bleeding - evaluation and treatment with blood products  Lucan Riner 09/20/2016, 5:12 AM

## 2016-09-21 ENCOUNTER — Ambulatory Visit (HOSPITAL_COMMUNITY): Payer: 59

## 2016-09-21 DIAGNOSIS — Z419 Encounter for procedure for purposes other than remedying health state, unspecified: Secondary | ICD-10-CM

## 2016-09-21 LAB — BASIC METABOLIC PANEL
Anion gap: 6 (ref 5–15)
BUN: 34 mg/dL — ABNORMAL HIGH (ref 6–20)
CO2: 24 mmol/L (ref 22–32)
Calcium: 8.3 mg/dL — ABNORMAL LOW (ref 8.9–10.3)
Chloride: 106 mmol/L (ref 101–111)
Creatinine, Ser: 2.66 mg/dL — ABNORMAL HIGH (ref 0.61–1.24)
GFR calc Af Amer: 28 mL/min — ABNORMAL LOW (ref >60)
GFR calc non Af Amer: 24 mL/min — ABNORMAL LOW (ref >60)
Glucose, Bld: 115 mg/dL — ABNORMAL HIGH (ref 65–99)
Potassium: 4.6 mmol/L (ref 3.5–5.1)
Sodium: 136 mmol/L (ref 135–145)

## 2016-09-21 LAB — TYPE AND SCREEN
ABO/RH(D): B POS
ABO/RH(D): B POS
ANTIBODY SCREEN: NEGATIVE
ANTIBODY SCREEN: NEGATIVE
UNIT DIVISION: 0
Unit division: 0

## 2016-09-21 LAB — BPAM RBC
BLOOD PRODUCT EXPIRATION DATE: 201804022359
Blood Product Expiration Date: 201804022359
ISSUE DATE / TIME: 201803140610
ISSUE DATE / TIME: 201803141659
UNIT TYPE AND RH: 7300
UNIT TYPE AND RH: 7300

## 2016-09-21 LAB — CBC
HCT: 25.4 % — ABNORMAL LOW (ref 39.0–52.0)
Hemoglobin: 8.7 g/dL — ABNORMAL LOW (ref 13.0–17.0)
MCH: 29.8 pg (ref 26.0–34.0)
MCHC: 34.3 g/dL (ref 30.0–36.0)
MCV: 87 fL (ref 78.0–100.0)
Platelets: 107 10*3/uL — ABNORMAL LOW (ref 150–400)
RBC: 2.92 MIL/uL — ABNORMAL LOW (ref 4.22–5.81)
RDW: 14.8 % (ref 11.5–15.5)
WBC: 10.7 10*3/uL — ABNORMAL HIGH (ref 4.0–10.5)

## 2016-09-21 MED ORDER — CEFAZOLIN IN D5W 1 GM/50ML IV SOLN
1.0000 g | Freq: Two times a day (BID) | INTRAVENOUS | Status: DC
  Administered 2016-09-21 – 2016-09-22 (×2): 1 g via INTRAVENOUS
  Filled 2016-09-21 (×3): qty 50

## 2016-09-21 NOTE — Progress Notes (Signed)
Pt reports he is doing well.  Tolerating pain well.  Eating normal diet.  Up with assistance to chair Would like to start ambulating with PT again Blood loss anemia: Required another transfusion yesterday afternoon. Denies fatigue, dizziness, chest pain, dyspnea Gout - left wrist: Given Colchicine yesterday. Would like IV switched to right hand. Discussed with nursing. Denies worsening symptoms AKI: improving  EXAM:  BP 126/70 (BP Location: Right Arm)   Pulse (!) 110   Temp 99 F (37.2 C) (Oral)   Resp 16   Ht 5\' 2"  (1.575 m)   Wt 74 kg (163 lb 2.3 oz)   SpO2 100%   BMI 29.84 kg/m  Awake, alert, oriented  Speech fluent, appropriate  CN grossly intact  Strength appropriate  Plan Appears to be doing well overall and making good progress Hospitalist consulted. When they determine patient is stable, will discharge to CIR. Appreciative of their assistance. Encouraged ambulation with PT/OT Only use po pain medications Continue drain until discharged

## 2016-09-21 NOTE — Evaluation (Signed)
Occupational Therapy Evaluation Patient Details Name: Donald Romero MRN: [id number] DOB: 02-01-1956 Today's Date: 09/21/2016    History of Present Illness Pt is a 61 y.o. male with scoliosis s/p fusion (at age 45) and recent progressive leg/back pain with radiculopathy, admitted 09/18/16 for L2-S1 laminectomy for decompression with T10-pelvis fixation/fusion. Pertinent PMH includes HTN, arthritis, gout.    Clinical Impression   Pt admitted with the above diagnoses and presents with below problem list. Pt will benefit from continued acute OT to address the below listed deficits and maximize independence with basic ADLs prior to d/c to venue below. PTA pt was mod I with ADLs. Pt is currently min to mod +2 physical A with LB ADLs and functional transfers/mobility; mod A for UB ADLs. Pt motivated to work with therapy. Feel he would be good candidate for CIR.     Follow Up Recommendations  CIR    Equipment Recommendations  Other (comment) (defer to next venue)    Recommendations for Other Services       Precautions / Restrictions Precautions Precautions: Fall;Back Precaution Booklet Issued: No Required Braces or Orthoses: Spinal Brace Spinal Brace: Lumbar corset;Applied in sitting position Restrictions Weight Bearing Restrictions: No Other Position/Activity Restrictions: gout in left hand and left knee      Mobility Bed Mobility Overal bed mobility: Needs Assistance Bed Mobility: Rolling;Sit to Sidelying Rolling: Min assist (cues for technique)       Sit to sidelying: Mod assist General bed mobility comments: cues for log rolling. Mod A to advance BLE onto bed and to maintain sidelying position during EOB>sidelying.   Transfers Overall transfer level: Needs assistance Equipment used: Rolling walker (2 wheeled) Transfers: Sit to/from [employer] Sit to Stand: Min assist;+2 physical assistance Stand pivot transfers: Min assist;Mod assist;+2 physical  assistance       General transfer comment: +2 min - mod powerup and steadying.     Balance Overall balance assessment: Needs assistance Sitting-balance support: No upper extremity supported;Feet supported Sitting balance-Leahy Scale: Fair Sitting balance - Comments: fair static balance, assist for sitting balance with dynamic activity (posterior lean) Postural control: Posterior lean Standing balance support: Bilateral upper extremity supported Standing balance-Leahy Scale: Poor                              ADL Overall ADL's : Needs assistance/impaired Eating/Feeding: Set up;Sitting   Grooming: Set up;Sitting   Upper Body Bathing: Moderate assistance;Sitting   Lower Body Bathing: Moderate assistance;+2 for physical assistance;Cueing for back precautions;Sit to/from stand   Upper Body Dressing : Moderate assistance;Sitting   Lower Body Dressing: Moderate assistance;+2 for physical assistance;Sit to/from stand;Cueing for back precautions   Toilet Transfer: +2 for physical assistance;Stand-pivot;BSC;RW;Moderate assistance;Minimal assistance   Toileting- Clothing Manipulation and Hygiene: Moderate assistance;Sit to/from stand       Functional mobility during ADLs: Minimal assistance;Moderate assistance;+2 for physical assistance;Rolling walker General ADL Comments: Pt received in recliner. Initially completed pivotal steps to bed and side steps along EOB. After brief seated rest break pt requesting to walk some more. Ambulated forward and back from EOB to couch. Then completed bed mobility. Reviewed BLT precautions and began education on strategies and AE for LB ADLs and pericare.      Vision         Perception     Praxis      Pertinent Vitals/Pain Pain Assessment: 0-10 Pain Score: 4  Pain Location: Back>left hand (gout) Pain Descriptors /  Indicators: Aching Pain Intervention(s): Limited activity within patient's tolerance;Monitored during  session;Premedicated before session;Repositioned     Hand Dominance     Extremity/Trunk Assessment Upper Extremity Assessment Upper Extremity Assessment: LUE deficits/detail LUE Deficits / Details: L hand gout with edema and pain on OT eval affecting grip strength. able to hold onto rw with B hands with R grip stronger than L grip. LUE Coordination: decreased fine motor;decreased gross motor   Lower Extremity Assessment Lower Extremity Assessment: Defer to PT evaluation       Communication Communication Communication: No difficulties   Cognition Arousal/Alertness: Awake/alert Behavior During Therapy: WFL for tasks assessed/performed Overall Cognitive Status: Within Functional Limits for tasks assessed                     General Comments       Exercises       Shoulder Instructions      Home Living Family/patient expects to be discharged to:: Private residence Living Arrangements: Non-relatives/Friends Available Help at Discharge: Friend(s);Available 24 hours/day Type of Home: House Home Access: Stairs to enter [employer] CorporationEntrance Stairs-Number of Steps: 1   Home Layout: One level     Bathroom Shower/Tub: Tub/shower unit         Home Equipment: Cane - single point          Prior Functioning/Environment Level of Independence: Independent with assistive device(s)        Comments: SPC for amb secondary to pain. Drives.         OT Problem List: Decreased activity tolerance;Impaired balance (sitting and/or standing);Decreased knowledge of use of DME or AE;Decreased knowledge of precautions;Pain;Impaired UE functional use      OT Treatment/Interventions: Self-care/ADL training;DME and/or AE instruction;Therapeutic activities;Patient/family education;Balance training    OT Goals(Current goals can be found in the care plan section) Acute Rehab OT Goals Patient Stated Goal: Return home OT Goal Formulation: With patient/family Time For Goal Achievement:  10/05/16 Potential to Achieve Goals: Good ADL Goals Pt Will Perform Upper Body Bathing: with min guard assist;sitting Pt Will Perform Lower Body Bathing: with mod assist;sit to/from stand;with adaptive equipment (+1) Pt Will Perform Upper Body Dressing: with min guard assist;sitting Pt Will Perform Lower Body Dressing: with mod assist;with adaptive equipment;sit to/from stand (+1) Pt Will Transfer to Toilet: ambulating;with max assist (3n1 over toilet) Pt Will Perform Toileting - Clothing Manipulation and hygiene: with min assist;sit to/from stand;with adaptive equipment Additional ADL Goal #1: Pt will complete bed mobility at min guard level to prepare for OOB ADLs.   OT Frequency: Min 2X/week   Barriers to D/C:            Co-evaluation              End of Session Equipment Utilized During Treatment: Gait belt;Rolling walker;Back brace  Activity Tolerance: Patient tolerated treatment well Patient left: in bed;with call bell/phone within reach;with SCD's reapplied;with bed alarm set;with family/visitor present  OT Visit Diagnosis: Unsteadiness on feet (R26.81);Pain Pain - Right/Left: Left Pain - part of body: Hand (back)                ADL either performed or assessed with clinical judgement  Time: 0940-1000 OT Time Calculation (min): 20 min Charges:  OT General Charges $OT Visit: 1 Procedure OT Evaluation $OT Eval Low Complexity: 1 Procedure G-Codes:       Pilar GrammesMathews, Paw Karstens H 09/21/2016, 10:30 AM

## 2016-09-21 NOTE — Progress Notes (Signed)
PROGRESS NOTE    Donald Romero  WUX:[id number] DOB: 1956-01-30 DOA: 09/18/2016 PCP: Pcp Not In System    Brief Narrative:  61 y/o M with scoliosis s/p fusion (at age 49) and recent progressive leg/back pain with radiculopathy admitted 3/12 for L2-Sq laminectomy for decompression with T10-pelvis fixation/fusion.  OR time prone approximately 10 hours.  Patient was left intubated and returned to ICU.  Extubated 3/13 without difficulty.  Post-op anemia 3/14 requiring transfusion.    Assessment & Plan:   Active Problems:   Scoliosis - per primary    Postoperative respiratory failure (HCC) - resolved and extubated 3/13    Sinus tachycardia - unclear etiology, will obtain ekg next am.    Acute post-operative pain -  Most likely contributing to sinus tachycardia  ARF - improving - most likely prerenal etiology - reassess next am.  Anemia - will reassess next am. No active bleeding currently.  DVT prophylaxis: per primary Code Status: Full Family Communication: d/c patient directly Disposition Plan: With resolution of tachycardia may transition from our standpoint.   Consultants:   PMNR  Critical care   Procedures: None   Antimicrobials: Cefazolin   Subjective: Pt has no new complaints. No acute issues overnight.  Objective: Vitals:   09/21/16 0108 09/21/16 0507 09/21/16 1044 09/21/16 1430  BP: 115/69 126/70 126/66 140/73  Pulse: (!) 119 (!) 110 (!) 115 (!) 130  Resp: 16 16 18 18   Temp: 99.5 F (37.5 C) 99 F (37.2 C) 98.8 F (37.1 C) 99.2 F (37.3 C)  TempSrc: Oral Oral Oral Oral  SpO2: 98% 100% 92% 91%  Weight:      Height:        Intake/Output Summary (Last 24 hours) at 09/21/16 1641 Last data filed at 09/21/16 0900  Gross per 24 hour  Intake              675 ml  Output              450 ml  Net              225 ml   Filed Weights   09/18/16 0608 09/19/16 1600  Weight: 74.8 kg (165 lb) 74 kg (163 lb 2.3 oz)    Examination:  General exam:  Appears calm and comfortable, in nad. Respiratory system: Clear to auscultation. Respiratory effort normal. equal chest rise. Cardiovascular system: S1 & S2 heard, no murmurs  Gastrointestinal system: Abdomen is nondistended, soft and nontender.  Central nervous system: Alert and oriented. No facial asymmetry Extremities: no cyanosis on limited exam. Skin: No rashes, lesions or ulcers on limited exam. Psychiatry: Judgement and insight appear normal. Mood & affect appropriate.   Data Reviewed: I have personally reviewed following labs and imaging studies  CBC:  Recent Labs Lab 09/18/16 1834 09/19/16 0318 09/20/16 0329 09/20/16 1338 09/21/16 0326  WBC 15.3* 18.4* 12.6*  --  10.7*  HGB 9.6* 8.5* 6.3* 7.9* 8.7*  HCT 29.3* 25.0* 18.9* 23.3* 25.4*  MCV 88.5 87.4 88.3  --  87.0  PLT 152 114* 104*  --  107*   Basic Metabolic Panel:  Recent Labs Lab 09/18/16 1300 09/18/16 1834 09/19/16 0318 09/20/16 0329 09/21/16 0326  NA 137 135 134* 135 136  K 4.6 6.4* 5.6* 4.5 4.6  CL  --  104 103 105 106  CO2  --  19* 24 24 24   GLUCOSE 161* 239* 115* 110* 115*  BUN  --  31* 34* 43* 34*  CREATININE  --  3.20* 3.40* 3.54* 2.66*  CALCIUM  --  7.9* 8.0* 8.1* 8.3*  MG  --   --  1.7 1.8  --   PHOS  --   --  3.9 4.2  --    GFR: Estimated Creatinine Clearance: 26.1 mL/min (A) (by C-G formula based on SCr of 2.66 mg/dL (H)). Liver Function Tests: No results for input(s): AST, ALT, ALKPHOS, BILITOT, PROT, ALBUMIN in the last 168 hours. No results for input(s): LIPASE, AMYLASE in the last 168 hours. No results for input(s): AMMONIA in the last 168 hours. Coagulation Profile:  Recent Labs Lab 09/19/16 0318  INR 1.13   Cardiac Enzymes:  Recent Labs Lab 09/18/16 1834  CKTOTAL 7,166*  CKMB 30.2*   BNP (last 3 results) No results for input(s): PROBNP in the last 8760 hours. HbA1C: No results for input(s): HGBA1C in the last 72 hours. CBG: No results for input(s): GLUCAP in the last  168 hours. Lipid Profile: No results for input(s): CHOL, HDL, LDLCALC, TRIG, CHOLHDL, LDLDIRECT in the last 72 hours. Thyroid Function Tests: No results for input(s): TSH, T4TOTAL, FREET4, T3FREE, THYROIDAB in the last 72 hours. Anemia Panel: No results for input(s): VITAMINB12, FOLATE, FERRITIN, TIBC, IRON, RETICCTPCT in the last 72 hours. Sepsis Labs: No results for input(s): PROCALCITON, LATICACIDVEN in the last 168 hours.  No results found for this or any previous visit (from the past 240 hour(s)).   Radiology Studies: Dg Chest Port 1 View  Result Date: 09/20/2016 CLINICAL DATA:  Respiratory failure. EXAM: PORTABLE CHEST 1 VIEW COMPARISON:  09/19/2016 . FINDINGS: Endotracheal tube and NG tube in stable position. Cardiomegaly with normal pulmonary vascularity. Right base atelectatic changes noted on today's exam. Persistent left base subsegmental atelectasis and/or pleuroparenchymal scarring. Small left pleural effusion cannot be excluded . IMPRESSION: 1.  Right base atelectatic changes noted on today's exam. 2. Persistent left base subsegmental atelectasis and/or pleuroparenchymal scarring. Small left pleural effusion cannot be excluded . 2. Cardiomegaly.  No pulmonary venous congestion. Electronically Signed   By: Maisie Fushomas  Register   On: 09/20/2016 07:55   Scheduled Meds: . sodium chloride   Intravenous Once  .  ceFAZolin (ANCEF) IV  1 g Intravenous Q12H  . docusate sodium  100 mg Oral BID  . gabapentin  300 mg Oral TID  . HYDROcodone-acetaminophen  1 tablet Oral Q6H  . senna  1 tablet Oral BID   Continuous Infusions: . lactated ringers 50 mL/hr at 09/20/16 1045     LOS: 3 days    Time spent: > 35 minutes  Penny PiaVEGA, Jaysha Lasure, MD [facility] Pager [phone]  If 7PM-7AM, please contact night-coverage [email or web address] Password [email or web address] 09/21/2016, 4:41 PM

## 2016-09-21 NOTE — Progress Notes (Signed)
Patient given incentive spirometer and educated on the use.  Patient return demonstrates proper steps to perform. Set goal at 1750 to start. Donald RadarHeather M [name]

## 2016-09-21 NOTE — Progress Notes (Signed)
Patient states that IV in his left arm is hurting, the IV is in a location that is near patients gout.  RN advised patient that could have a new IV started in his right arm and remove the IV in his left.  RN placed order to have IV team place a new IV for patient.  Patient advised IV team RN that he does not want to have a new IV placed.    RN will continue to monitor IV & gout site

## 2016-09-21 NOTE — Progress Notes (Signed)
Inpatient Rehabilitation  Met with patient and family member to discuss team's recommendation for IP Rehab.  Patient eager to regain his independence and would like the opportunity to receive therapy on IP Rehab.  Hemoglobin remains low, but has improved since yesterday and he demonstrated good progress with therapy today.  Patient now transitioned to oral pain meds which he reports is controlling his pain following his session.  Will initiate insurance authorization.  Plan to follow for timing of medical readiness, insurance authorization, and bed availability.  Please call with questions.   Carmelia Roller., CCC/SLP Admission Coordinator  [facility]  Cell [phone]

## 2016-09-21 NOTE — Progress Notes (Signed)
Physical Therapy Treatment Patient Details Name: Donald CarnesJames R Romero MRN: [id number] DOB: 1955/12/06 Today's Date: 09/21/2016    History of Present Illness Pt is a 61 y.o. male with scoliosis s/p fusion (at age 61) and recent progressive leg/back pain with radiculopathy, admitted 09/18/16 for L2-S1 laminectomy for decompression with T10-pelvis fixation/fusion. Pertinent PMH includes HTN, arthritis, gout.     PT Comments    Pt is moving better with therapy this session. Performed short distance gait with +2 assistance, sit to stand and stand pivot transfers with decreased assistance required this session. Pt will benefit from continued acute PT services to continue working on increasing gait distance and mobility prior to discharge to next venue listed below.     Follow Up Recommendations  CIR     Equipment Recommendations  Other (comment) (Defer to next venue)    Recommendations for Other Services       Precautions / Restrictions Precautions Precautions: Fall;Back Precaution Booklet Issued: No Precaution Comments: verbally reviewed precautions Required Braces or Orthoses: Spinal Brace Spinal Brace: Lumbar corset;Applied in sitting position Restrictions Weight Bearing Restrictions: No Other Position/Activity Restrictions: gout in left hand and left knee    Mobility  Bed Mobility Overal bed mobility: Needs Assistance Bed Mobility: Rolling;Sit to Sidelying Rolling: Min assist       Sit to sidelying: Mod assist General bed mobility comments: cues for log rolling. Mod A to advance BLE onto bed and to maintain sidelying position during EOB>sidelying.   Transfers Overall transfer level: Needs assistance Equipment used: Rolling walker (2 wheeled) Transfers: Sit to/from [employer]Stand;Stand Pivot Transfers Sit to Stand: Min assist;+2 physical assistance Stand pivot transfers: Min assist;Mod assist;+2 physical assistance       General transfer comment: +2 min - mod powerup and  steadying.   Ambulation/Gait Ambulation/Gait assistance: Mod assist;+2 physical assistance Ambulation Distance (Feet): 15 Feet (5' forward and side stepping) Assistive device: Rolling walker (2 wheeled) Gait Pattern/deviations: Step-to pattern;Decreased step length - left;Decreased step length - right Gait velocity: decreased Gait velocity interpretation: Below normal speed for age/gender General Gait Details: decreased weight shift to left, assistance to maintain weight bearing through LLE with stance.    Stairs            Wheelchair Mobility    Modified Rankin (Stroke Patients Only)       Balance Overall balance assessment: Needs assistance Sitting-balance support: No upper extremity supported;Feet supported Sitting balance-Leahy Scale: Fair Sitting balance - Comments: fair static balance, assist for sitting balance with dynamic activity (posterior lean) Postural control: Posterior lean Standing balance support: Bilateral upper extremity supported Standing balance-Leahy Scale: Poor                      Cognition Arousal/Alertness: Awake/alert Behavior During Therapy: WFL for tasks assessed/performed Overall Cognitive Status: Within Functional Limits for tasks assessed                      Exercises      General Comments        Pertinent Vitals/Pain Pain Assessment: 0-10 Pain Score: 4  Pain Location: Back>left hand (gout) Pain Descriptors / Indicators: Aching Pain Intervention(s): Limited activity within patient's tolerance    Home Living Family/patient expects to be discharged to:: Private residence Living Arrangements: Non-relatives/Friends Available Help at Discharge: Friend(s);Available 24 hours/day Type of Home: House Home Access: Stairs to enter   Home Layout: One level Home Equipment: Gilmer MorCane - single point      Prior  Function Level of Independence: Independent with assistive device(s)      Comments: SPC for amb secondary to  pain. Drives.    PT Goals (current goals can now be found in the care plan section) Acute Rehab PT Goals Patient Stated Goal: Return home Progress towards PT goals: Progressing toward goals    Frequency    Min 5X/week      PT Plan Current plan remains appropriate    Co-evaluation PT/OT/SLP Co-Evaluation/Treatment: Yes Reason for Co-Treatment: Complexity of the patient's impairments (multi-system involvement);For patient/therapist safety PT goals addressed during session: Mobility/safety with mobility       End of Session Equipment Utilized During Treatment: Gait belt;Back brace Activity Tolerance: Patient limited by pain;Patient tolerated treatment well Patient left: in bed;with nursing/sitter in room;with call bell/phone within reach Nurse Communication: Mobility status PT Visit Diagnosis: Muscle weakness (generalized) (M62.81);Other abnormalities of gait and mobility (R26.89)     Time: 0940-1000 PT Time Calculation (min) (ACUTE ONLY): 20 min  Charges:  $Gait Training: 8-22 mins                    G Codes:       Colin Broach PT, DPT  [phone]  09/21/2016, 10:38 AM

## 2016-09-22 ENCOUNTER — Inpatient Hospital Stay (HOSPITAL_COMMUNITY)
Admission: RE | Admit: 2016-09-22 | Discharge: 2016-09-30 | DRG: 560 | Disposition: A | Discharge status: Home Health | Payer: 59 | Source: Intra-hospital | Attending: Physical Medicine & Rehabilitation | Admitting: Physical Medicine & Rehabilitation

## 2016-09-22 DIAGNOSIS — M10062 Idiopathic gout, left knee: Secondary | ICD-10-CM

## 2016-09-22 DIAGNOSIS — M5416 Radiculopathy, lumbar region: Secondary | ICD-10-CM

## 2016-09-22 DIAGNOSIS — M4106 Infantile idiopathic scoliosis, lumbar region: Secondary | ICD-10-CM | POA: Diagnosis not present

## 2016-09-22 DIAGNOSIS — M10061 Idiopathic gout, right knee: Secondary | ICD-10-CM | POA: Diagnosis not present

## 2016-09-22 DIAGNOSIS — D62 Acute posthemorrhagic anemia: Secondary | ICD-10-CM | POA: Diagnosis not present

## 2016-09-22 DIAGNOSIS — E46 Unspecified protein-calorie malnutrition: Secondary | ICD-10-CM | POA: Diagnosis not present

## 2016-09-22 DIAGNOSIS — I129 Hypertensive chronic kidney disease with stage 1 through stage 4 chronic kidney disease, or unspecified chronic kidney disease: Secondary | ICD-10-CM

## 2016-09-22 DIAGNOSIS — R000 Tachycardia, unspecified: Secondary | ICD-10-CM

## 2016-09-22 DIAGNOSIS — M109 Gout, unspecified: Secondary | ICD-10-CM | POA: Diagnosis not present

## 2016-09-22 DIAGNOSIS — E860 Dehydration: Secondary | ICD-10-CM | POA: Diagnosis not present

## 2016-09-22 DIAGNOSIS — M4126 Other idiopathic scoliosis, lumbar region: Secondary | ICD-10-CM | POA: Diagnosis not present

## 2016-09-22 DIAGNOSIS — M10042 Idiopathic gout, left hand: Secondary | ICD-10-CM | POA: Diagnosis not present

## 2016-09-22 DIAGNOSIS — N179 Acute kidney failure, unspecified: Secondary | ICD-10-CM | POA: Diagnosis not present

## 2016-09-22 DIAGNOSIS — M4120 Other idiopathic scoliosis, site unspecified: Secondary | ICD-10-CM | POA: Diagnosis not present

## 2016-09-22 DIAGNOSIS — R5381 Other malaise: Secondary | ICD-10-CM | POA: Diagnosis not present

## 2016-09-22 DIAGNOSIS — E8809 Other disorders of plasma-protein metabolism, not elsewhere classified: Secondary | ICD-10-CM

## 2016-09-22 DIAGNOSIS — I10 Essential (primary) hypertension: Secondary | ICD-10-CM

## 2016-09-22 DIAGNOSIS — Z4782 Encounter for orthopedic aftercare following scoliosis surgery: Secondary | ICD-10-CM | POA: Diagnosis present

## 2016-09-22 DIAGNOSIS — N183 Chronic kidney disease, stage 3 unspecified: Secondary | ICD-10-CM

## 2016-09-22 DIAGNOSIS — F17210 Nicotine dependence, cigarettes, uncomplicated: Secondary | ICD-10-CM | POA: Diagnosis not present

## 2016-09-22 DIAGNOSIS — Z9889 Other specified postprocedural states: Secondary | ICD-10-CM | POA: Diagnosis not present

## 2016-09-22 DIAGNOSIS — G8918 Other acute postprocedural pain: Secondary | ICD-10-CM | POA: Diagnosis not present

## 2016-09-22 DIAGNOSIS — Z79899 Other long term (current) drug therapy: Secondary | ICD-10-CM

## 2016-09-22 DIAGNOSIS — M419 Scoliosis, unspecified: Secondary | ICD-10-CM | POA: Diagnosis present

## 2016-09-22 DIAGNOSIS — M1000 Idiopathic gout, unspecified site: Secondary | ICD-10-CM

## 2016-09-22 LAB — BASIC METABOLIC PANEL
ANION GAP: 8 (ref 5–15)
BUN: 25 mg/dL — ABNORMAL HIGH (ref 6–20)
CALCIUM: 8.8 mg/dL — AB (ref 8.9–10.3)
CHLORIDE: 105 mmol/L (ref 101–111)
CO2: 25 mmol/L (ref 22–32)
CREATININE: 2.11 mg/dL — AB (ref 0.61–1.24)
GFR calc non Af Amer: 32 mL/min — ABNORMAL LOW (ref >60)
GFR, EST AFRICAN AMERICAN: 38 mL/min — AB (ref >60)
Glucose, Bld: 108 mg/dL — ABNORMAL HIGH (ref 65–99)
Potassium: 4.4 mmol/L (ref 3.5–5.1)
SODIUM: 138 mmol/L (ref 135–145)

## 2016-09-22 LAB — CBC
HEMATOCRIT: 26.2 % — AB (ref 39.0–52.0)
HEMOGLOBIN: 8.7 g/dL — AB (ref 13.0–17.0)
MCH: 29 pg (ref 26.0–34.0)
MCHC: 33.2 g/dL (ref 30.0–36.0)
MCV: 87.3 fL (ref 78.0–100.0)
Platelets: 154 10*3/uL (ref 150–400)
RBC: 3 MIL/uL — ABNORMAL LOW (ref 4.22–5.81)
RDW: 14.4 % (ref 11.5–15.5)
WBC: 10.2 10*3/uL (ref 4.0–10.5)

## 2016-09-22 MED ORDER — SENNOSIDES-DOCUSATE SODIUM 8.6-50 MG PO TABS
2.0000 | ORAL_TABLET | Freq: Every day | ORAL | Status: DC
  Administered 2016-09-22 – 2016-09-28 (×7): 2 via ORAL
  Filled 2016-09-22 (×7): qty 2

## 2016-09-22 MED ORDER — ACETAMINOPHEN 325 MG PO TABS: 325.0000 mg | ORAL_TABLET | ORAL | Status: DC | PRN

## 2016-09-22 MED ORDER — TRAZODONE HCL 50 MG PO TABS
25.0000 mg | ORAL_TABLET | Freq: Every evening | ORAL | Status: DC | PRN
Start: 2016-09-22 — End: 2016-09-30
  Administered 2016-09-24 – 2016-09-26 (×2): 50 mg via ORAL
  Filled 2016-09-22 (×2): qty 1

## 2016-09-22 MED ORDER — SORBITOL 70 % SOLN
45.0000 mL | Freq: Once | Status: AC
  Administered 2016-09-22: 45 mL via ORAL
  Filled 2016-09-22: qty 60

## 2016-09-22 MED ORDER — FLEET ENEMA 7-19 GM/118ML RE ENEM: 1.0000 | ENEMA | Freq: Once | RECTAL | Status: DC | PRN

## 2016-09-22 MED ORDER — GABAPENTIN 300 MG PO CAPS: 300.0000 mg | ORAL_CAPSULE | Freq: Three times a day (TID) | ORAL | 2 refills | Status: DC

## 2016-09-22 MED ORDER — METHOCARBAMOL 500 MG PO TABS
500.0000 mg | ORAL_TABLET | Freq: Four times a day (QID) | ORAL | Status: DC | PRN
  Administered 2016-09-26 – 2016-09-30 (×7): 500 mg via ORAL
  Filled 2016-09-22 (×7): qty 1

## 2016-09-22 MED ORDER — MENTHOL 3 MG MT LOZG
1.0000 | LOZENGE | OROMUCOSAL | Status: DC | PRN
  Filled 2016-09-22: qty 9

## 2016-09-22 MED ORDER — FAMOTIDINE 20 MG PO TABS: 20.0000 mg | ORAL_TABLET | Freq: Every day | ORAL | Status: DC | PRN

## 2016-09-22 MED ORDER — GUAIFENESIN-DM 100-10 MG/5ML PO SYRP: 5.0000 mL | ORAL_SOLUTION | Freq: Four times a day (QID) | ORAL | Status: DC | PRN

## 2016-09-22 MED ORDER — ENOXAPARIN SODIUM 40 MG/0.4ML ~~LOC~~ SOLN
40.0000 mg | SUBCUTANEOUS | Status: DC
  Administered 2016-09-25 – 2016-09-29 (×5): 40 mg via SUBCUTANEOUS
  Filled 2016-09-22 (×5): qty 0.4

## 2016-09-22 MED ORDER — GABAPENTIN 300 MG PO CAPS
300.0000 mg | ORAL_CAPSULE | Freq: Three times a day (TID) | ORAL | Status: DC
  Administered 2016-09-22 – 2016-09-30 (×23): 300 mg via ORAL
  Filled 2016-09-22 (×23): qty 1

## 2016-09-22 MED ORDER — POLYETHYLENE GLYCOL 3350 17 G PO PACK
17.0000 g | PACK | Freq: Every day | ORAL | Status: DC | PRN
  Administered 2016-09-25: 17 g via ORAL
  Filled 2016-09-22: qty 1

## 2016-09-22 MED ORDER — METHOCARBAMOL 750 MG PO TABS: 750.0000 mg | ORAL_TABLET | Freq: Three times a day (TID) | ORAL | 1 refills | Status: DC | PRN

## 2016-09-22 MED ORDER — PHENOL 1.4 % MT LIQD: 1.0000 | OROMUCOSAL | Status: DC | PRN

## 2016-09-22 MED ORDER — DIPHENHYDRAMINE HCL 12.5 MG/5ML PO ELIX: 12.5000 mg | ORAL_SOLUTION | Freq: Four times a day (QID) | ORAL | Status: DC | PRN

## 2016-09-22 MED ORDER — ALUM & MAG HYDROXIDE-SIMETH 200-200-20 MG/5ML PO SUSP: 30.0000 mL | Freq: Four times a day (QID) | ORAL | Status: DC | PRN

## 2016-09-22 MED ORDER — OXYCODONE-ACETAMINOPHEN 7.5-325 MG PO TABS: 1.0000 | ORAL_TABLET | ORAL | 0 refills | Status: DC | PRN

## 2016-09-22 MED ORDER — BISACODYL 10 MG RE SUPP: 10.0000 mg | Freq: Every day | RECTAL | Status: DC | PRN

## 2016-09-22 MED ORDER — PROCHLORPERAZINE 25 MG RE SUPP: 12.5000 mg | Freq: Four times a day (QID) | RECTAL | Status: DC | PRN

## 2016-09-22 MED ORDER — CEFAZOLIN IN D5W 1 GM/50ML IV SOLN
1.0000 g | Freq: Two times a day (BID) | INTRAVENOUS | Status: DC
  Filled 2016-09-22 (×2): qty 50

## 2016-09-22 MED ORDER — CEFAZOLIN IN D5W 1 GM/50ML IV SOLN
1.0000 g | Freq: Two times a day (BID) | INTRAVENOUS | Status: AC
  Administered 2016-09-22 – 2016-09-23 (×2): 1 g via INTRAVENOUS
  Filled 2016-09-22 (×2): qty 50

## 2016-09-22 MED ORDER — PROCHLORPERAZINE EDISYLATE 5 MG/ML IJ SOLN: 5.0000 mg | Freq: Four times a day (QID) | INTRAMUSCULAR | Status: DC | PRN

## 2016-09-22 MED ORDER — ALUM & MAG HYDROXIDE-SIMETH 200-200-20 MG/5ML PO SUSP: 30.0000 mL | ORAL | Status: DC | PRN

## 2016-09-22 MED ORDER — BISACODYL 10 MG RE SUPP
10.0000 mg | Freq: Every day | RECTAL | Status: DC | PRN
  Administered 2016-09-25: 10 mg via RECTAL
  Filled 2016-09-22: qty 1

## 2016-09-22 MED ORDER — PROCHLORPERAZINE MALEATE 5 MG PO TABS: 5.0000 mg | ORAL_TABLET | Freq: Four times a day (QID) | ORAL | Status: DC | PRN

## 2016-09-22 MED ORDER — HYDROCODONE-ACETAMINOPHEN 7.5-325 MG PO TABS
1.0000 | ORAL_TABLET | ORAL | Status: DC | PRN
  Administered 2016-09-22 – 2016-09-30 (×31): 1 via ORAL
  Filled 2016-09-22 (×32): qty 1

## 2016-09-22 NOTE — Progress Notes (Signed)
Physical Therapy Treatment Patient Details Name: Donald Romero MRN: [id number] DOB: 04/23/1956 Today's Date: 09/22/2016    History of Present Illness Pt is a 61 y.o. male with scoliosis s/p fusion (at age 89) and recent progressive leg/back pain with radiculopathy, admitted 09/18/16 for L2-S1 laminectomy for decompression with T10-pelvis fixation/fusion. Pertinent PMH includes HTN, arthritis, gout.     PT Comments    Pt demonstrates improved tolerance for gait this session with increased distance to 50' with chair follow. Pt is still Min A +2 for mobility but is making excellent improvements toward goals. Pt continues to have weakness in LUE which limits it's use with powering up from chairs and pt needs assistance to position hand on RUE.    Follow Up Recommendations  CIR     Equipment Recommendations    Defer to next venue   Recommendations for Other Services       Precautions / Restrictions Precautions Precautions: Fall;Back Precaution Booklet Issued: No Precaution Comments: verbally reviewed precautions Required Braces or Orthoses: Spinal Brace Spinal Brace: Lumbar corset;Applied in sitting position Restrictions Weight Bearing Restrictions: No Other Position/Activity Restrictions: gout in left hand and left knee    Mobility  Bed Mobility               General bed mobility comments: OOB in recliner when PT arrives  Transfers Overall transfer level: Needs assistance Equipment used: Rolling walker (2 wheeled) Transfers: Sit to/from Stand Sit to Stand: Min guard         General transfer comment: Min A with cues for hand placement and assistance to place RUE on recliner to assist with standing  Ambulation/Gait Ambulation/Gait assistance: Min assist;+2 safety/equipment Ambulation Distance (Feet): 50 Feet Assistive device: Rolling walker (2 wheeled) Gait Pattern/deviations: Step-to pattern;Decreased step length - left;Decreased step length - right Gait  velocity: decreased Gait velocity interpretation: Below normal speed for age/gender General Gait Details: decreased weight shift to left, assistance to maintain weight bearing through LLE with stance.    Stairs            Wheelchair Mobility    Modified Rankin (Stroke Patients Only)       Balance Overall balance assessment: Needs assistance Sitting-balance support: No upper extremity supported;Feet supported Sitting balance-Leahy Scale: Fair                              Cognition Arousal/Alertness: Awake/alert Behavior During Therapy: WFL for tasks assessed/performed Overall Cognitive Status: Within Functional Limits for tasks assessed                      Exercises      General Comments General comments (skin integrity, edema, etc.): Wife assisted throughout treatment with mobilization of IV pole      Pertinent Vitals/Pain Pain Assessment: Faces Faces Pain Scale: Hurts a little bit Pain Location: Back>left hand (gout) Pain Descriptors / Indicators: Aching Pain Intervention(s): Monitored during session;Repositioned    Home Living                      Prior Function            PT Goals (current goals can now be found in the care plan section) Acute Rehab PT Goals Patient Stated Goal: Return home Progress towards PT goals: Progressing toward goals    Frequency    Min 5X/week      PT Plan Current plan remains  appropriate    Co-evaluation             End of Session Equipment Utilized During Treatment: Gait belt;Back brace Activity Tolerance: Patient tolerated treatment well Patient left: in chair;with call bell/phone within reach Nurse Communication: Mobility status PT Visit Diagnosis: Muscle weakness (generalized) (M62.81);Other abnormalities of gait and mobility (R26.89)     Time: 8469-6295 PT Time Calculation (min) (ACUTE ONLY): 20 min  Charges:  $Gait Training: 8-22 mins                    G Codes:        Colin Broach PT, DPT  [phone]  09/22/2016, 12:58 PM

## 2016-09-22 NOTE — Progress Notes (Signed)
Inpatient Rehabilitation  Received notification from [name] that patient is approved for IP Rehab today.  Note MD states that patient is medically ready and plan to precede with admission later today.    Charlane FerrettiMelissa Leeon Makar, M.A., CCC/SLP Admission Coordinator  [facility]  Cell [phone]

## 2016-09-22 NOTE — H&P (Addendum)
Physical Medicine and Rehabilitation Admission H&P    CC: Scoliosis  HPI:  Donald Romero is a 61 y.o. male with history of HTN, CKD stage III, idiopathic scoliosis treated with T2- L2 fusion at age 20 and now with progressive worsening of back and RLE> LLE pain. He was found to have scoliosis and lumbosacral spondylosis with radiculopathy and elected to undergo  T 10- pelvis fixation and fusion with  L2 to S1 decompression by Dr. Cyndy Freeze. Patient with prolonged surgery with acute on chronic renal failure, hypotension, ABLA and need for prolonged intubation. PCCM consulted for assistance and patient tolerated extubation on 3/13. He has had flare up of gout in left hand and was started on cholchine   He was transfused with one unit PRBC for drop in H/H to 6.3/18.9. Issues with tachycardia felt to be related to pain and anemia per TRH.   Therapy ongoing and patient noted to have decreased WB on LLE, decreased balance with posterior lean and weakness affecting mobility and ability to carry out ADL tasks. CIR recommended for follow up therapy.    Review of Systems  Constitutional: Negative for fever.  HENT: Negative for hearing loss and tinnitus.   Eyes: Negative for blurred vision and double vision.  Respiratory: Negative for cough.   Cardiovascular: Negative for chest pain and palpitations.  Gastrointestinal: Negative for abdominal pain, nausea and vomiting.  Genitourinary: Negative for dysuria.  Musculoskeletal: Positive for back pain and joint pain. Negative for myalgias.  Skin: Negative for rash.  Neurological: Negative for tingling and sensory change.  Psychiatric/Behavioral: Negative for depression.      Past Medical History:  Diagnosis Date  . Arthritis   . Gout   . Hypertension     Past Surgical History:  Procedure Laterality Date  . BACK SURGERY    . COLONOSCOPY    . TONSILLECTOMY    . WRIST SURGERY Right     Family History  Problem Relation Age of Onset  .  Hypertension Mother   . Lung disease Father     asbestosis  . 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 Independent without AD PTA. Has a roommate (male) who can assist after discharge. He reports that he has been smoking.  He has been smoking about 0.50 packs per day. He has never used smokeless tobacco. He reports that he does not drink alcohol or use drugs.    No Known Allergies  Medications Prior to Admission  Medication Sig Dispense Refill  . acetaminophen (TYLENOL) 650 MG CR tablet Take 650-1,300 mg by mouth every 8 (eight) hours as needed for pain.    Marland Kitchen gabapentin (NEURONTIN) 300 MG capsule Take 1 capsule (300 mg total) by mouth 3 (three) times daily. 90 capsule 2  . hydrALAZINE (APRESOLINE) 100 MG tablet Take 100 mg by mouth 3 (three) times daily.    . indomethacin (INDOCIN) 25 MG capsule Take 25 mg by mouth 3 (three) times daily as needed (gout flare ups).     . methocarbamol (ROBAXIN-750) 750 MG tablet Take 1 tablet (750 mg total) by mouth 3 (three) times daily as needed for muscle spasms. 90 tablet 1  . oxyCODONE-acetaminophen (PERCOCET) 7.5-325 MG tablet Take 1-2 tablets by mouth every 4 (four) hours as needed for severe pain. 60 tablet 0    Home:     Functional History:    Functional Status:  Mobility: min to mod assist with transfers, mod  assist 15 ft gait          ADL:  mod assist ADL's   Cognition:     : There were no vitals taken for this visit. Physical Exam  Constitutional: He is oriented to person, place, and time. He appears well-developed and well-nourished. No distress.  HENT:  Head: Normocephalic and atraumatic.  Eyes: EOM are normal. Pupils are equal, round, and reactive to light.  Neck: No JVD present. No tracheal deviation present. No thyromegaly present.  Cardiovascular: Normal rate and regular rhythm.  Exam reveals no gallop and no friction rub.   No murmur heard. Respiratory:  Effort normal. No respiratory distress. He has no wheezes. He has no rales.  GI: He exhibits no distension. There is no tenderness. There is no rebound.  Musculoskeletal: He exhibits no edema.  Swelling and warmth at left wrist and left knee with associated tenderness. Back expectedly tender. Lumbar brace in place  Neurological: He is alert and oriented to person, place, and time. No cranial nerve deficit.  UE 5/5 prox to distal. LE: 3/5 HF, 4/5 KE and 4+ ADF/PF. Pt only limited at left wrist/grip and left knee due to pain. No sensory findings.   Skin: No rash noted. No erythema.  Surgical incision dressed, drain intact with significant sanguinous drainage in container.   Psychiatric: He has a normal mood and affect. His behavior is normal. Judgment and thought content normal.    Results for orders placed or performed during the hospital encounter of 09/18/16 (from the past 48 hour(s))  CBC     Status: Abnormal   Collection Time: 09/21/16  3:26 AM  Result Value Ref Range   WBC 10.7 (H) 4.0 - 10.5 K/uL   RBC 2.92 (L) 4.22 - 5.81 MIL/uL   Hemoglobin 8.7 (L) 13.0 - 17.0 g/dL   HCT 25.4 (L) 39.0 - 52.0 %   MCV 87.0 78.0 - 100.0 fL   MCH 29.8 26.0 - 34.0 pg   MCHC 34.3 30.0 - 36.0 g/dL   RDW 14.8 11.5 - 15.5 %   Platelets 107 (L) 150 - 400 K/uL    Comment: CONSISTENT WITH PREVIOUS RESULT  Basic metabolic panel     Status: Abnormal   Collection Time: 09/21/16  3:26 AM  Result Value Ref Range   Sodium 136 135 - 145 mmol/L   Potassium 4.6 3.5 - 5.1 mmol/L   Chloride 106 101 - 111 mmol/L   CO2 24 22 - 32 mmol/L   Glucose, Bld 115 (H) 65 - 99 mg/dL   BUN 34 (H) 6 - 20 mg/dL   Creatinine, Ser 2.66 (H) 0.61 - 1.24 mg/dL   Calcium 8.3 (L) 8.9 - 10.3 mg/dL   GFR calc non Af Amer 24 (L) >60 mL/min   GFR calc Af Amer 28 (L) >60 mL/min    Comment: (NOTE) The eGFR has been calculated using the CKD EPI equation. This calculation has not been validated in all clinical situations. eGFR's  persistently <60 mL/min signify possible Chronic Kidney Disease.    Anion gap 6 5 - 15  CBC     Status: Abnormal   Collection Time: 09/22/16  3:13 AM  Result Value Ref Range   WBC 10.2 4.0 - 10.5 K/uL   RBC 3.00 (L) 4.22 - 5.81 MIL/uL   Hemoglobin 8.7 (L) 13.0 - 17.0 g/dL   HCT 26.2 (L) 39.0 - 52.0 %   MCV 87.3 78.0 - 100.0 fL   MCH 29.0 26.0 - 34.0  pg   MCHC 33.2 30.0 - 36.0 g/dL   RDW 14.4 11.5 - 15.5 %   Platelets 154 150 - 400 K/uL  Basic metabolic panel     Status: Abnormal   Collection Time: 09/22/16  3:13 AM  Result Value Ref Range   Sodium 138 135 - 145 mmol/L   Potassium 4.4 3.5 - 5.1 mmol/L   Chloride 105 101 - 111 mmol/L   CO2 25 22 - 32 mmol/L   Glucose, Bld 108 (H) 65 - 99 mg/dL   BUN 25 (H) 6 - 20 mg/dL   Creatinine, Ser 2.11 (H) 0.61 - 1.24 mg/dL   Calcium 8.8 (L) 8.9 - 10.3 mg/dL   GFR calc non Af Amer 32 (L) >60 mL/min   GFR calc Af Amer 38 (L) >60 mL/min    Comment: (NOTE) The eGFR has been calculated using the CKD EPI equation. This calculation has not been validated in all clinical situations. eGFR's persistently <60 mL/min signify possible Chronic Kidney Disease.    Anion gap 8 5 - 15   No results found.     Medical Problem List and Plan: 1.  Functional and mobility deficits secondary to lumbar scoliosis and radiculopathy necessitating L2-S1 decompression with T10---pelvis fusion  -admit to inpatient rehab 2.  DVT Prophylaxis/Anticoagulation: Pharmaceutical: Lovenox--  start tomorrow. SCDs. Check dopplers to rule out DVT as cause of fevers.  3. Pain Management:  Managed by hydrocodone prn   -pain appears well controlled at present in respect to back. Gout actually causing more pain than anything else currently 4. Mood: LCSW to follow for evaluation and support.  5. Neuropsych: This patient is capable of making decisions on his own behalf. 6. Skin/Wound Care: Monitor incision for healing. Routie pressure relief measures. Maintain adequate  nutritional and hydration status.   -drain per NS  -no showering yet 7. Fluids/Electrolytes/Nutrition: Encourage fluid intake. Will check lytes in am.  8. Gout flare: Received one dose colchicine--pt is noticing some improvement but pain is still limiting left wrist and left knee.  -consider steroid rx 9. Low grade fevers?FUO:  On Cefazolin. Check UA/UCS. Very likely due to gout flare-- Encourage IS 10. ABLA: Improved post transfusion. Monitor serially.  11. Tachycardia: Multifactorial due to pain, anemia and deconditioning.  12. Acute on chronic renal failure: Resolving. Avoid nephrotoxic medication and hypotension.  Encourage fluid intake.     Post Admission Physician Evaluation: 1. Functional deficits secondary  to lumbar scoliosis/radiculopathy s/p decompression and fusion. 2. Patient is admitted to receive collaborative, interdisciplinary care between the physiatrist, rehab nursing staff, and therapy team. 3. Patient's level of medical complexity and substantial therapy needs in context of that medical necessity cannot be provided at a lesser intensity of care such as a SNF. 4. Patient has experienced substantial functional loss from his/her baseline which was documented above under the "Functional History" and "Functional Status" headings.  Judging by the patient's diagnosis, physical exam, and functional history, the patient has potential for functional progress which will result in measurable gains while on inpatient rehab.  These gains will be of substantial and practical use upon discharge  in facilitating mobility and self-care at the household level. 5. Physiatrist will provide 24 hour management of medical needs as well as oversight of the therapy plan/treatment and provide guidance as appropriate regarding the interaction of the two. 6. The Preadmission Screening has been reviewed and patient status is unchanged unless otherwise stated above. 7. 24 hour rehab nursing will assist  with bladder  management, bowel management, safety, skin/wound care, disease management, medication administration, pain management and patient education  and help integrate therapy concepts, techniques,education, etc. 8. PT will assess and treat for/with: Lower extremity strength, range of motion, stamina, balance, functional mobility, safety, adaptive techniques and equipment, NMR, spinal precautions, pain control.   Goals are: mod I. 9. OT will assess and treat for/with: ADL's, functional mobility, safety, upper extremity strength, adaptive techniques and equipment, NMR, back precautions, pain mgt, ego support, community reintegration.   Goals are: mod I. Therapy may not yet proceed with showering this patient. 10. SLP will assess and treat for/with: n/a.  Goals are: n/a. 11. Case Management and Social Worker will assess and treat for psychological issues and discharge planning. 12. Team conference will be held weekly to assess progress toward goals and to determine barriers to discharge. 13. Patient will receive at least 3 hours of therapy per day at least 5 days per week. 14. ELOS: 10-14 days       15. Prognosis:  excellent     Meredith Staggers, MD, [facility] 09/22/2016  Meredith Staggers, MD 09/22/2016

## 2016-09-22 NOTE — Progress Notes (Signed)
Patient medically cleared to transition to CIR from my standpoint. Chart and vitals reviewed. Serum creatinine continues to trend down.  Linzy Laury, [employer]

## 2016-09-22 NOTE — Progress Notes (Signed)
[facility] Admission Coordinator Signed Physical Medicine and Rehabilitation  PMR Pre-admission Date of Service: 09/22/2016 1:05 PM  Related encounter: Admission (Current) from 09/18/2016 in [facility] 46M NEURO MEDICAL       [] Hide copied text PMR Admission Coordinator Pre-Admission Assessment  Patient: Donald Romero is an 61 y.o., male MRN: [id number] DOB: 1955-07-14 Height: 5\' 2"  (157.5 cm) Weight: 74 kg (163 lb 2.3 oz)                                                                                                                                                  Insurance Information HMO:     PPO: X     PCP:      IPA:      80/20:      OTHER:  PRIMARYMonia Pouch      Policy#: [id number]      Subscriber: Self CM Name: Kandace Parkins      Phone#: [phone]     Fax#: [phone] (has EPIC Access) Pre-Cert#: [id number]      Employer: Full Time Benefits:  Phone #: [phone]     Name: Automated Line Eff. Date: 07/10/14     Deduct: $3,000      Out of Pocket Max: $7,350      Life Max: N/A CIR: 70%/30%      SNF: 70%/30% Outpatient: PT/OT     Co-Pay: $40 per visit, 90 visit limit  Home Health: 70%      Co-Pay: 30% DME: 70%     Co-Pay: 30% Providers: in network   Medicaid Application Date:       Case Manager:  Disability Application Date:       Case Worker:   Emergency Contact Information        Contact Information    Name Relation Home Work Mobile   Nordic Sister   [phone]   Paul Half   [phone]     Current Medical History  Patient Admitting Diagnosis: Bilateral lower extremity radiculopathy secondary to lumbar spinal stenosis/scoliosisstatus post decompression and fusion 09/18/2016.  History of Present Illness: Donald Romero a 60 y.o.malewith history of HTN, CKD stage III, idiopathic scoliosis treated with T2- L2 fusion at age 41 and now with progressive worsening of back and RLE>LLE pain. He was found to have  scoliosis and lumbosacral spondylosis with radiculopathy and elected to undergo T 10- pelvis fixation and fusion with L2 to S1 decompression by Dr. Bevely Palmer. Patient with prolonged surgery with acute on chronic renal failure, hypotension, ABLA and need for prolonged intubation. PCCM consulted for assistance and patient tolerated extubation on 3/13. He has had flare up of gout in left hand and was started on cholchine.  He was transfused with one unit PRBC for drop in H/H to 6.3/18.9. Issues with tachycardia felt to be related to pain and  anemia per TRH. Therapy ongoing and patient noted to have decreased WB on LLE, decreased balance with posterior lean and weakness affecting mobility and ability to carry out ADL tasks. CIR recommended for follow up therapy.   Past Medical History  Past Medical History:  Diagnosis Date  . Arthritis   . Gout   . Hypertension     Family History  family history includes Hypertension in his mother; Lung disease in his father; Thyroid cancer in his brother.  Prior Rehab/Hospitalizations:  Has the patient had major surgery during 100 days prior to admission? No  Current Medications   Current Facility-Administered Medications:  .  0.9 %  sodium chloride infusion, , Intravenous, Once, Loura Halt Ditty, MD .  acetaminophen (TYLENOL) tablet 650 mg, 650 mg, Oral, Q4H PRN, 650 mg at 09/22/16 0352 **OR** acetaminophen (TYLENOL) suppository 650 mg, 650 mg, Rectal, Q4H PRN, Darci Current Costella, PA-C .  alum & mag hydroxide-simeth (MAALOX/MYLANTA) 200-200-20 MG/5ML suspension 30 mL, 30 mL, Oral, Q6H PRN, Darci Current Costella, PA-C, 30 mL at 09/19/16 1556 .  bisacodyl (DULCOLAX) suppository 10 mg, 10 mg, Rectal, Daily PRN, Darci Current Costella, PA-C .  ceFAZolin (ANCEF) IVPB 1 g/50 mL premix, 1 g, Intravenous, Q12H, Loura Halt Ditty, MD, 1 g at 09/22/16 0954 .  docusate sodium (COLACE) capsule 100 mg, 100 mg, Oral, BID, Vincent J Costella, PA-C, 100 mg at  09/22/16 0954 .  famotidine (PEPCID) tablet 20 mg, 20 mg, Oral, Daily PRN, Karl Ito, MD, 20 mg at 09/19/16 1844 .  fentaNYL (SUBLIMAZE) injection 50-100 mcg, 50-100 mcg, Intravenous, Q2H PRN, Jeanella Craze, NP, 50 mcg at 09/20/16 0454 .  gabapentin (NEURONTIN) capsule 300 mg, 300 mg, Oral, TID, Darci Current Costella, PA-C, 300 mg at 09/22/16 0954 .  HYDROcodone-acetaminophen (NORCO) 7.5-325 MG per tablet 1 tablet, 1 tablet, Oral, Q6H, Vincent J Costella, PA-C, 1 tablet at 09/22/16 0352 .  lactated ringers infusion, , Intravenous, Continuous, Jeanella Craze, NP, Last Rate: 50 mL/hr at 09/20/16 1045 .  menthol-cetylpyridinium (CEPACOL) lozenge 3 mg, 1 lozenge, Oral, PRN **OR** phenol (CHLORASEPTIC) mouth spray 1 spray, 1 spray, Mouth/Throat, PRN, Darci Current Costella, PA-C .  methocarbamol (ROBAXIN) tablet 500 mg, 500 mg, Oral, Q6H PRN, 500 mg at 09/19/16 1554 **OR** methocarbamol (ROBAXIN) 500 mg in dextrose 5 % 50 mL IVPB, 500 mg, Intravenous, Q6H PRN, Vincent J Costella, PA-C .  ondansetron (ZOFRAN) tablet 4 mg, 4 mg, Oral, Q6H PRN **OR** ondansetron (ZOFRAN) injection 4 mg, 4 mg, Intravenous, Q6H PRN, Darci Current Costella, PA-C .  oxyCODONE (Oxy IR/ROXICODONE) immediate release tablet 5-10 mg, 5-10 mg, Oral, Q3H PRN, Darci Current Costella, PA-C, 5 mg at 09/21/16 2208 .  senna (SENOKOT) tablet 8.6 mg, 1 tablet, Oral, BID, Vincent J Costella, PA-C, 8.6 mg at 09/22/16 0954 .  senna-docusate (Senokot-S) tablet 1 tablet, 1 tablet, Oral, QHS PRN, Darci Current Costella, PA-C .  sodium phosphate (FLEET) 7-19 GM/118ML enema 1 enema, 1 enema, Rectal, Once PRN, Alyson Ingles, PA-C  Patients Current Diet: Diet heart healthy/carb modified Room service appropriate? Yes; Fluid consistency: Thin Diet - low sodium heart healthy  Precautions / Restrictions Precautions Precautions: Fall, Back Precaution Booklet Issued: No Precaution Comments: verbally reviewed precautions Spinal Brace: Lumbar corset, Applied  in sitting position Restrictions Weight Bearing Restrictions: No Other Position/Activity Restrictions: gout in left hand and left knee   Has the patient had 2 or more falls or a fall with injury in the past year?No, 1 fall without  injury   Prior Activity Level Community (5-7x/wk): Prior to admission patient was fully independent working full time taht required him to climb stairs, adjust equipment, and he also did yard work.    Home Assistive Devices / Equipment Home Assistive Devices/Equipment: Cane (specify quad or straight) Home Equipment: Cane - single point  Prior Device Use: Indicate devices/aids used by the patient prior to current illness, exacerbation or injury? single point cane just prior to admission due to decline   Prior Functional Level Prior Function Level of Independence: Independent Comments: SPC for amb secondary to pain. Drives.   Self Care: Did the patient need help bathing, dressing, using the toilet or eating? Independent  Indoor Mobility: Did the patient need assistance with walking from room to room (with or without device)? Independent  Stairs: Did the patient need assistance with internal or external stairs (with or without device)? Independent  Functional Cognition: Did the patient need help planning regular tasks such as shopping or remembering to take medications? Independent  Current Functional Level Cognition  Overall Cognitive Status: Within Functional Limits for tasks assessed Orientation Level: Oriented X4    Extremity Assessment (includes Sensation/Coordination)  Upper Extremity Assessment: LUE deficits/detail LUE Deficits / Details: L hand gout with edema and pain on OT eval affecting grip strength. able to hold onto rw with B hands with R grip stronger than L grip. LUE Coordination: decreased fine motor, decreased gross motor  Lower Extremity Assessment: Defer to PT evaluation    ADLs  Overall ADL's : Needs  assistance/impaired Eating/Feeding: Set up, Sitting Grooming: Set up, Sitting Upper Body Bathing: Moderate assistance, Sitting Lower Body Bathing: Moderate assistance, +2 for physical assistance, Cueing for back precautions, Sit to/from stand Upper Body Dressing : Moderate assistance, Sitting Lower Body Dressing: Moderate assistance, +2 for physical assistance, Sit to/from stand, Cueing for back precautions Toilet Transfer: +2 for physical assistance, Stand-pivot, BSC, RW, Moderate assistance, Minimal assistance Toileting- Clothing Manipulation and Hygiene: Moderate assistance, Sit to/from stand Functional mobility during ADLs: Minimal assistance, Moderate assistance, +2 for physical assistance, Rolling walker General ADL Comments: Pt received in recliner. Initially completed pivotal steps to bed and side steps along EOB. After brief seated rest break pt requesting to walk some more. Ambulated forward and back from EOB to couch. Then completed bed mobility. Reviewed BLT precautions and began education on strategies and AE for LB ADLs and pericare.     Mobility  Overal bed mobility: Needs Assistance Bed Mobility: Rolling, Sit to Sidelying Rolling: Min assist Sidelying to sit: Mod assist Sit to sidelying: Mod assist General bed mobility comments: OOB in recliner when PT arrives    Transfers  Overall transfer level: Needs assistance Equipment used: Rolling walker (2 wheeled) Transfers: Sit to/from Stand Sit to Stand: Min guard Stand pivot transfers: Min assist, Mod assist, +2 physical assistance General transfer comment: Min A with cues for hand placement and assistance to place RUE on recliner to assist with standing    Ambulation / Gait / Stairs / Wheelchair Mobility  Ambulation/Gait Ambulation/Gait assistance: Min assist, +2 safety/equipment Ambulation Distance (Feet): 50 Feet Assistive device: Rolling walker (2 wheeled) Gait Pattern/deviations: Step-to pattern, Decreased step  length - left, Decreased step length - right General Gait Details: decreased weight shift to left, assistance to maintain weight bearing through LLE with stance.  Gait velocity: decreased Gait velocity interpretation: Below normal speed for age/gender    Posture / Balance Dynamic Sitting Balance Sitting balance - Comments: fair static balance, assist for sitting balance  with dynamic activity (posterior lean) Balance Overall balance assessment: Needs assistance Sitting-balance support: No upper extremity supported, Feet supported Sitting balance-Leahy Scale: Fair Sitting balance - Comments: fair static balance, assist for sitting balance with dynamic activity (posterior lean) Postural control: Posterior lean Standing balance support: Bilateral upper extremity supported Standing balance-Leahy Scale: Poor    Special needs/care consideration BiPAP/CPAP: No CPM: No Continuous Drip IV: No Dialysis: No         Life Vest: No Oxygen: No Special Bed: No Trach Size: No Wound Vac (area): No       Skin: WDL                               Bowel mgmt: 09/18/16, PTA per pt report constipation needs to be addresed  Bladder mgmt: Continent  Diabetic mgmt: Reports no, but is on a Carb Mod diet so education may need to be completed by RN.      Previous Home Environment Living Arrangements: Non-relatives/Friends Available Help at Discharge: Friend(s), Available 24 hours/day Type of Home: House Home Layout: One level Home Access: Stairs to enter Secretary/administrator of Steps: 1 Bathroom Shower/Tub: Tub/shower unit Home Care Services: No  Discharge Living Setting Plans for Discharge Living Setting: Patient's home, House, Lives with (comment) (friend ) Type of Home at Discharge: House Discharge Home Layout: One level Discharge Home Access: Stairs to enter Entrance Stairs-Rails: Can reach both Entrance Stairs-Number of Steps: 2, porch, then 1 Discharge Bathroom Shower/Tub: Tub/shower  unit, Door Discharge Bathroom Toilet: Standard Discharge Bathroom Accessibility: Yes How Accessible: Accessible via walker Does the patient have any problems obtaining your medications?: No  Social/Family/Support Systems Patient Roles: Other (Comment) (Friend) Contact Information: FriendEarl Many [phone] Anticipated Caregiver: Waldron Session Anticipated Caregiver's Contact Information: see above  Ability/Limitations of Caregiver: None Caregiver Availability: 24/7 Discharge Plan Discussed with Primary Caregiver: Yes Is Caregiver In Agreement with Plan?: Yes Does Caregiver/Family have Issues with Lodging/Transportation while Pt is in Rehab?: No  Goals/Additional Needs Patient/Family Goal for Rehab: PT/OT Mod I- Supervision  Expected length of stay: 10-14 days  Cultural Considerations: None Dietary Needs: Heart Healthy/Carb Mod. Equipment Needs: TBD Special Service Needs: None Additional Information: None Pt/Family Agrees to Admission and willing to participate: Yes Program Orientation Provided & Reviewed with Pt/Caregiver Including Roles  & Responsibilities: Yes Additional Information Needs: Pt with corset back brace Information Needs to be Provided By: Team FYI   Decrease burden of Care through IP rehab admission: No  Possible need for SNF placement upon discharge: No  Patient Condition: This patient's medical and functional status has changed since the consult dated: 09/20/16 at 0943 in which the Rehabilitation Physician determined and documented that the patient's condition is appropriate for intensive rehabilitative care in an inpatient rehabilitation facility. See "History of Present Illness" (above) for medical update. Functional changes are: Mod A +2 transfers. Patient's medical and functional status update has been discussed with the Rehabilitation physician and patient remains appropriate for inpatient rehabilitation. Will admit to inpatient rehab  today.  Preadmission Screen Completed By:  Fae Pippin, 09/22/2016 1:05 PM ______________________________________________________________________   Discussed status with Dr. Riley Kill on 09/22/16 at 1315 and received telephone approval for admission today.  Admission Coordinator:  Fae Pippin, time 1315/Date 09/22/16       Cosigned by: Ranelle Oyster, MD at 09/22/2016 1:19 PM  Revision History

## 2016-09-22 NOTE — Progress Notes (Signed)
Inpatient Rehabilitation  Continuing to follow for timing of medical readiness and insurance authorization.  I have reached out to insurance this morning to request a status update; I am hopeful for potential decision today.  Plan to update team as I know.  Please call with questions.    Charlane FerrettiMelissa Cele Mote, M.A., CCC/SLP Admission Coordinator  [facility]  Cell [phone]

## 2016-09-22 NOTE — Progress Notes (Signed)
Received pt. As a transfer from 5 M.Pt. And his family were oriented to the unit routine and protocol.Safety plan was explained.Fall prevention plan was explained and signed by the pt. And RN.

## 2016-09-22 NOTE — H&P (Signed)
Physical Medicine and Rehabilitation Admission H&P    CC: Scoliosis  HPI:  Donald Romero is a 61 y.o. male with history of HTN, CKD stage III, idiopathic scoliosis treated with T2- L2 fusion at age 35 and now with progressive worsening of back and RLE> LLE pain. He was found to have scoliosis and lumbosacral spondylosis with radiculopathy and elected to undergo  T 10- pelvis fixation and fusion with  L2 to S1 decompression by Dr. Cyndy Freeze. Patient with prolonged surgery with acute on chronic renal failure, hypotension, ABLA and need for prolonged intubation. PCCM consulted for assistance and patient tolerated extubation on 3/13. He has had flare up of gout in left hand and was started on cholchine   He was transfused with one unit PRBC for drop in H/H to 6.3/18.9. Issues with tachycardia felt to be related to pain and anemia per TRH.   Therapy ongoing and patient noted to have decreased WB on LLE, decreased balance with posterior lean and weakness affecting mobility and ability to carry out ADL tasks. CIR recommended for follow up therapy.    Review of Systems  Constitutional: Negative for fever.  HENT: Negative for hearing loss and tinnitus.   Eyes: Negative for blurred vision and double vision.  Respiratory: Negative for cough.   Cardiovascular: Negative for chest pain and palpitations.  Gastrointestinal: Negative for abdominal pain, nausea and vomiting.  Genitourinary: Negative for dysuria.  Musculoskeletal: Positive for back pain and joint pain. Negative for myalgias.  Skin: Negative for rash.  Neurological: Negative for tingling and sensory change.  Psychiatric/Behavioral: Negative for depression.      Past Medical History:  Diagnosis Date  . Arthritis   . Gout   . Hypertension     Past Surgical History:  Procedure Laterality Date  . BACK SURGERY    . COLONOSCOPY    . TONSILLECTOMY    . WRIST SURGERY Right     Family History  Problem Relation Age of Onset  .  Hypertension Mother   . Lung disease Father     asbestosis  . 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 Independent without AD PTA. Has a roommate (male) who can assist after discharge. He reports that he has been smoking.  He has been smoking about 0.50 packs per day. He has never used smokeless tobacco. He reports that he does not drink alcohol or use drugs.    Allergies  Allergen Reactions  . No Known Allergies     Medications Prior to Admission  Medication Sig Dispense Refill  . Acetaminophen (TYLENOL ARTHRITIS PAIN PO) Take 650-1,300 mg by mouth every 8 (eight) hours as needed (for pain.).    Marland Kitchen hydrALAZINE (APRESOLINE) 100 MG tablet Take 100 mg by mouth 3 (three) times daily.    . indomethacin (INDOCIN) 25 MG capsule Take 25 mg by mouth 3 (three) times daily as needed (gout flare ups).       Home: Home Living Family/patient expects to be discharged to:: Private residence Living Arrangements: Non-relatives/Friends Available Help at Discharge: Friend(s), Available 24 hours/day Type of Home: House Home Access: Stairs to enter [employer] of Steps: 1 Home Layout: One level Bathroom Shower/Tub: Tub/shower unit Home Equipment: Cane - single point   Functional History: Prior Function Level of Independence: Independent with assistive device(s) Comments: SPC for amb secondary to pain. Drives.   Functional Status:  Mobility: Bed Mobility Overal bed mobility: Needs Assistance Bed Mobility:  Rolling, Sit to Sidelying Rolling: Min assist Sidelying to sit: Mod assist Sit to sidelying: Mod assist General bed mobility comments: cues for log rolling. Mod A to advance BLE onto bed and to maintain sidelying position during EOB>sidelying.  Transfers Overall transfer level: Needs assistance Equipment used: Rolling walker (2 wheeled) Transfers: Sit to/from Stand, Stand Pivot Transfers Sit to Stand: Min assist, +2  physical assistance Stand pivot transfers: Min assist, Mod assist, +2 physical assistance General transfer comment: +2 min - mod powerup and steadying.  Ambulation/Gait Ambulation/Gait assistance: Mod assist, +2 physical assistance Ambulation Distance (Feet): 15 Feet (5' forward and side stepping) Assistive device: Rolling walker (2 wheeled) Gait Pattern/deviations: Step-to pattern, Decreased step length - left, Decreased step length - right General Gait Details: decreased weight shift to left, assistance to maintain weight bearing through LLE with stance.  Gait velocity: decreased Gait velocity interpretation: Below normal speed for age/gender    ADL: ADL Overall ADL's : Needs assistance/impaired Eating/Feeding: Set up, Sitting Grooming: Set up, Sitting Upper Body Bathing: Moderate assistance, Sitting Lower Body Bathing: Moderate assistance, +2 for physical assistance, Cueing for back precautions, Sit to/from stand Upper Body Dressing : Moderate assistance, Sitting Lower Body Dressing: Moderate assistance, +2 for physical assistance, Sit to/from stand, Cueing for back precautions Toilet Transfer: +2 for physical assistance, Stand-pivot, BSC, RW, Moderate assistance, Minimal assistance Toileting- Clothing Manipulation and Hygiene: Moderate assistance, Sit to/from stand Functional mobility during ADLs: Minimal assistance, Moderate assistance, +2 for physical assistance, Rolling walker General ADL Comments: Pt received in recliner. Initially completed pivotal steps to bed and side steps along EOB. After brief seated rest break pt requesting to walk some more. Ambulated forward and back from EOB to couch. Then completed bed mobility. Reviewed BLT precautions and began education on strategies and AE for LB ADLs and pericare.   Cognition: Cognition Overall Cognitive Status: Within Functional Limits for tasks assessed Orientation Level: Oriented X4 Cognition Arousal/Alertness:  Awake/alert Behavior During Therapy: WFL for tasks assessed/performed Overall Cognitive Status: Within Functional Limits for tasks assessed : Blood pressure 113/68, pulse (!) 106, temperature 98.9 F (37.2 C), temperature source Oral, resp. rate 20, height 5' 2"  (1.575 m), weight 74 kg (163 lb 2.3 oz), SpO2 97 %. Physical Exam  Constitutional: He is oriented to person, place, and time. He appears well-developed and well-nourished. No distress.  HENT:  Head: Normocephalic and atraumatic.  Eyes: EOM are normal. Pupils are equal, round, and reactive to light.  Neck: No JVD present. No tracheal deviation present. No thyromegaly present.  Cardiovascular: Normal rate and regular rhythm.  Exam reveals no gallop and no friction rub.   No murmur heard. Respiratory: Effort normal. No respiratory distress. He has no wheezes. He has no rales.  GI: He exhibits no distension. There is no tenderness. There is no rebound.  Musculoskeletal: He exhibits no edema.  Swelling and warmth at left wrist and left knee with associated tenderness. Back expectedly tender. Lumbar brace in place  Neurological: He is alert and oriented to person, place, and time. No cranial nerve deficit.  UE 5/5 prox to distal. LE: 3/5 HF, 4/5 KE and 4+ ADF/PF. Pt only limited at left wrist/grip and left knee due to pain. No sensory findings.   Skin: No rash noted. No erythema.  Surgical incision dressed, drain intact with significant sanguinous drainage in container.   Psychiatric: He has a normal mood and affect. His behavior is normal. Judgment and thought content normal.    Results for orders placed or  performed during the hospital encounter of 09/18/16 (from the past 48 hour(s))  Hemoglobin and hematocrit, blood     Status: Abnormal   Collection Time: 09/20/16  1:38 PM  Result Value Ref Range   Hemoglobin 7.9 (L) 13.0 - 17.0 g/dL    Comment: REPEATED TO VERIFY POST TRANSFUSION SPECIMEN    HCT 23.3 (L) 39.0 - 52.0 %  Prepare  RBC     Status: None   Collection Time: 09/20/16  4:30 PM  Result Value Ref Range   Order Confirmation ORDER PROCESSED BY BLOOD BANK   CBC     Status: Abnormal   Collection Time: 09/21/16  3:26 AM  Result Value Ref Range   WBC 10.7 (H) 4.0 - 10.5 K/uL   RBC 2.92 (L) 4.22 - 5.81 MIL/uL   Hemoglobin 8.7 (L) 13.0 - 17.0 g/dL   HCT 25.4 (L) 39.0 - 52.0 %   MCV 87.0 78.0 - 100.0 fL   MCH 29.8 26.0 - 34.0 pg   MCHC 34.3 30.0 - 36.0 g/dL   RDW 14.8 11.5 - 15.5 %   Platelets 107 (L) 150 - 400 K/uL    Comment: CONSISTENT WITH PREVIOUS RESULT  Basic metabolic panel     Status: Abnormal   Collection Time: 09/21/16  3:26 AM  Result Value Ref Range   Sodium 136 135 - 145 mmol/L   Potassium 4.6 3.5 - 5.1 mmol/L   Chloride 106 101 - 111 mmol/L   CO2 24 22 - 32 mmol/L   Glucose, Bld 115 (H) 65 - 99 mg/dL   BUN 34 (H) 6 - 20 mg/dL   Creatinine, Ser 2.66 (H) 0.61 - 1.24 mg/dL   Calcium 8.3 (L) 8.9 - 10.3 mg/dL   GFR calc non Af Amer 24 (L) >60 mL/min   GFR calc Af Amer 28 (L) >60 mL/min    Comment: (NOTE) The eGFR has been calculated using the CKD EPI equation. This calculation has not been validated in all clinical situations. eGFR's persistently <60 mL/min signify possible Chronic Kidney Disease.    Anion gap 6 5 - 15  CBC     Status: Abnormal   Collection Time: 09/22/16  3:13 AM  Result Value Ref Range   WBC 10.2 4.0 - 10.5 K/uL   RBC 3.00 (L) 4.22 - 5.81 MIL/uL   Hemoglobin 8.7 (L) 13.0 - 17.0 g/dL   HCT 26.2 (L) 39.0 - 52.0 %   MCV 87.3 78.0 - 100.0 fL   MCH 29.0 26.0 - 34.0 pg   MCHC 33.2 30.0 - 36.0 g/dL   RDW 14.4 11.5 - 15.5 %   Platelets 154 150 - 400 K/uL  Basic metabolic panel     Status: Abnormal   Collection Time: 09/22/16  3:13 AM  Result Value Ref Range   Sodium 138 135 - 145 mmol/L   Potassium 4.4 3.5 - 5.1 mmol/L   Chloride 105 101 - 111 mmol/L   CO2 25 22 - 32 mmol/L   Glucose, Bld 108 (H) 65 - 99 mg/dL   BUN 25 (H) 6 - 20 mg/dL   Creatinine, Ser 2.11 (H)  0.61 - 1.24 mg/dL   Calcium 8.8 (L) 8.9 - 10.3 mg/dL   GFR calc non Af Amer 32 (L) >60 mL/min   GFR calc Af Amer 38 (L) >60 mL/min    Comment: (NOTE) The eGFR has been calculated using the CKD EPI equation. This calculation has not been validated in all clinical situations. eGFR's persistently <60  mL/min signify possible Chronic Kidney Disease.    Anion gap 8 5 - 15   No results found.     Medical Problem List and Plan: 1.  Functional and mobility deficits secondary to lumbar scoliosis and radiculopathy necessitating L2-S1 decompression with T10---pelvis fusion  -admit to inpatient rehab 2.  DVT Prophylaxis/Anticoagulation: Pharmaceutical: Lovenox--  start tomorrow. SCDs. Check dopplers to rule out DVT as cause of fevers.  3. Pain Management:  Managed by hydrocodone prn   -pain appears well controlled at present in respect to back. Gout actually causing more pain than anything else currently 4. Mood: LCSW to follow for evaluation and support.  5. Neuropsych: This patient is capable of making decisions on his own behalf. 6. Skin/Wound Care: Monitor incision for healing. Routie pressure relief measures. Maintain adequate nutritional and hydration status.   -drain per NS  -no showering yet 7. Fluids/Electrolytes/Nutrition: Encourage fluid intake. Will check lytes in am.  8. Gout flare: Received one dose colchicine--pt is noticing some improvement but pain is still limiting left wrist and left knee.  -consider steroid rx 9. Low grade fevers?FUO:  On Cefazolin. Check UA/UCS. Very likely due to gout flare-- Encourage IS 10. ABLA: Improved post transfusion. Monitor serially.  11. Tachycardia: Multifactorial due to pain, anemia and deconditioning.  12. Acute on chronic renal failure: Resolving. Avoid nephrotoxic medication and hypotension.  Encourage fluid intake.     Post Admission Physician Evaluation: 1. Functional deficits secondary  to lumbar scoliosis/radiculopathy s/p  decompression and fusion. 2. Patient is admitted to receive collaborative, interdisciplinary care between the physiatrist, rehab nursing staff, and therapy team. 3. Patient's level of medical complexity and substantial therapy needs in context of that medical necessity cannot be provided at a lesser intensity of care such as a SNF. 4. Patient has experienced substantial functional loss from his/her baseline which was documented above under the "Functional History" and "Functional Status" headings.  Judging by the patient's diagnosis, physical exam, and functional history, the patient has potential for functional progress which will result in measurable gains while on inpatient rehab.  These gains will be of substantial and practical use upon discharge  in facilitating mobility and self-care at the household level. 5. Physiatrist will provide 24 hour management of medical needs as well as oversight of the therapy plan/treatment and provide guidance as appropriate regarding the interaction of the two. 6. The Preadmission Screening has been reviewed and patient status is unchanged unless otherwise stated above. 7. 24 hour rehab nursing will assist with bladder management, bowel management, safety, skin/wound care, disease management, medication administration, pain management and patient education  and help integrate therapy concepts, techniques,education, etc. 8. PT will assess and treat for/with: Lower extremity strength, range of motion, stamina, balance, functional mobility, safety, adaptive techniques and equipment, NMR, spinal precautions, pain control.   Goals are: mod I. 9. OT will assess and treat for/with: ADL's, functional mobility, safety, upper extremity strength, adaptive techniques and equipment, NMR, back precautions, pain mgt, ego support, community reintegration.   Goals are: mod I. Therapy may not yet proceed with showering this patient. 10. SLP will assess and treat for/with: n/a.  Goals  are: n/a. 11. Case Management and Social Worker will assess and treat for psychological issues and discharge planning. 12. Team conference will be held weekly to assess progress toward goals and to determine barriers to discharge. 13. Patient will receive at least 3 hours of therapy per day at least 5 days per week. 14. ELOS: 10-14 days  15. Prognosis:  excellent     Meredith Staggers, MD, [facility] 09/22/2016  Bary Leriche, Hershal Coria 09/22/2016

## 2016-09-22 NOTE — Progress Notes (Signed)
Contacted Neurology regarding drain as well as lovenox. PA recommends waiting for 7 days prior to starting Lovenox and ok to remove drain. Will discontinue drain and prophylactic antibiotic in am.

## 2016-09-22 NOTE — Discharge Summary (Signed)
Physician Discharge Summary  Patient ID: Donald Romero MRN: [id number] DOB/AGE: 1956-04-19 61 y.o.  Admit date: 09/18/2016 Discharge date: 09/22/2016  Admission Diagnosis: PRE-OPERATIVE DIAGNOSIS:  Idiopathic scoliosis   POST-OPERATIVE DIAGNOSIS:  Idiopathic scoliosis   Discharge Diagnoses:  Same Active Problems:   Scoliosis   Postoperative respiratory failure (HCC)   Sinus tachycardia   Acute post-operative pain   Goals of care, counseling/discussion   Acute respiratory failure with hypoxia [facility])   Discharged Condition: Stable  Hospital Course:  JASTEN GUYETTE is a 61 y.o. male who was admitted for the surgery below. He was remained extubated after surgery until the following day. He was found to have acute blood loss anemia and AKI. He received multiple transfusions. He continues to improve. He was also seen and evaluated from [facility] and PCCM. Appreciative of their assistance. They have determined that he is okay to go to CIR. Pain is well controlled on po medications.  Treatments: Surgery -  Lumbar two-Sacrum one  Laminectomy for decompression with Thoracic ten  to pelvis fixation/fusion with Mazor robot (N/A) APPLICATION OF ROBOTIC ASSISTANCE FOR SPINAL PROCEDURE (N/A)  Discharge Exam: Blood pressure 110/62, pulse (!) 103, temperature 98.3 F (36.8 C), temperature source Oral, resp. rate 20, height 5\' 2"  (1.575 m), weight 74 kg (163 lb 2.3 oz), SpO2 93 %. Awake, alert, oriented Speech fluent, appropriate CN grossly intact Strength normal BUE/BLE Wound c/d/i  Disposition: CIR  Discharge Instructions    Call MD for:  difficulty breathing, headache or visual disturbances    Complete by:  As directed    Call MD for:  persistant dizziness or light-headedness    Complete by:  As directed    Call MD for:  redness, tenderness, or signs of infection (pain, swelling, redness, odor or green/yellow discharge around incision site)    Complete by:  As directed    Call MD for:   severe uncontrolled pain    Complete by:  As directed    Call MD for:  temperature >100.4    Complete by:  As directed    Diet - low sodium heart healthy    Complete by:  As directed    Driving Restrictions    Complete by:  As directed    Do not drive until given clearance.   Incentive spirometry RT    Complete by:  As directed    Increase activity slowly    Complete by:  As directed    Lifting restrictions    Complete by:  As directed    Do not lift anything >10lbs. Avoid bending and twisting in awkward positions. Avoid bending at the back.   May shower / Bathe    Complete by:  As directed    Okay to wash wound with warm soapy water. Avoid scrubbing the wound. Pat dry.   Remove dressing in 24 hours    Complete by:  As directed      Allergies as of 09/22/2016      Reactions   No Known Allergies       Medication List    TAKE these medications   gabapentin 300 MG capsule Commonly known as:  NEURONTIN Take 1 capsule (300 mg total) by mouth 3 (three) times daily.   hydrALAZINE 100 MG tablet Commonly known as:  APRESOLINE Take 100 mg by mouth 3 (three) times daily.   indomethacin 25 MG capsule Commonly known as:  INDOCIN Take 25 mg by mouth 3 (three) times daily as needed (gout  flare ups).   methocarbamol 750 MG tablet Commonly known as:  ROBAXIN-750 Take 1 tablet (750 mg total) by mouth 3 (three) times daily as needed for muscle spasms.   oxyCODONE-acetaminophen 7.5-325 MG tablet Commonly known as:  PERCOCET Take 1-2 tablets by mouth every 4 (four) hours as needed for severe pain.   TYLENOL ARTHRITIS PAIN PO Take 650-1,300 mg by mouth every 8 (eight) hours as needed (for pain.).        SignedAlyson Ingles: Reeves Musick J 09/22/2016, 1:09 PM

## 2016-09-22 NOTE — [place] FL2 (Signed)
[place] MEDICAID FL2 LEVEL OF CARE SCREENING TOOL     IDENTIFICATION  Patient Name: Donald Romero Birthdate: 1955/09/29 Sex: male Admission Date (Current Location): 09/18/2016  [facility] [place]   ([place] in [place], [place])   Facility and Address:  [facility], [address], [place], [place] [address]      Provider Number: [id number]  Attending Physician Name and Address:  Loura HaltBenjamin Jared Ditty, MD  Relative Name and Phone Number:  Rolanda JayJones,Debra - Sister; [phone]     Current Level of Care: Hospital Recommended Level of Care: Skilled Nursing Facility Prior Approval Number:    Date Approved/Denied:   PASRR Number:    Discharge Plan: SNF    Current Diagnoses: Patient Active Problem List   Diagnosis Date Noted  . Acute respiratory failure with hypoxia (HCC)   . Scoliosis 09/18/2016  . Postoperative respiratory failure (HCC) 09/18/2016  . Sinus tachycardia 09/18/2016  . Acute post-operative pain 09/18/2016  . Goals of care, counseling/discussion 09/18/2016    Orientation RESPIRATION BLADDER Height & Weight     Self, Time, Situation, Place  Normal Continent Weight: 163 lb 2.3 oz (74 kg) Height:  5\' 2"  (157.5 cm)  BEHAVIORAL SYMPTOMS/MOOD NEUROLOGICAL BOWEL NUTRITION STATUS      Continent Diet (Low sodium - heart healthy)  AMBULATORY STATUS COMMUNICATION OF NEEDS Skin   Extensive Assist Verbally Normal, Other (Comment) (Incision back)                       Personal Care Assistance Level of Assistance  Bathing, Feeding, Dressing Bathing Assistance: Maximum assistance Feeding assistance: Independent (assistance with set-up) Dressing Assistance: Maximum assistance     Functional Limitations Info  Sight, Hearing, Speech Sight Info: Adequate Hearing Info: Adequate Speech Info: Adequate    SPECIAL CARE FACTORS FREQUENCY  PT (By licensed PT), OT (By licensed OT)     PT Frequency: Evaluated 3/13 and a minimum of  5X per week therapy recommended OT Frequency: Evaluated 3/15 and a minimum of 2X per week therapy recommended            Contractures Contractures Info: Not present    Additional Factors Info  Code Status, Allergies Code Status Info: Full Allergies Info: No known allergies           Current Medications (09/22/2016):  This is the current hospital active medication list Current Facility-Administered Medications  Medication Dose Route Frequency Provider Last Rate Last Dose  . 0.9 %  sodium chloride infusion   Intravenous Once Loura HaltBenjamin Jared Ditty, MD      . acetaminophen (TYLENOL) tablet 650 mg  650 mg Oral Q4H PRN Alyson InglesVincent J Costella, PA-C   650 mg at 09/22/16 0352   Or  . acetaminophen (TYLENOL) suppository 650 mg  650 mg Rectal Q4H PRN Alyson InglesVincent J Costella, PA-C      . alum & mag hydroxide-simeth (MAALOX/MYLANTA) 200-200-20 MG/5ML suspension 30 mL  30 mL Oral Q6H PRN Darci CurrentVincent J Costella, PA-C   30 mL at 09/19/16 1556  . bisacodyl (DULCOLAX) suppository 10 mg  10 mg Rectal Daily PRN Darci CurrentVincent J Costella, PA-C      . ceFAZolin (ANCEF) IVPB 1 g/50 mL premix  1 g Intravenous Q12H Loura HaltBenjamin Jared Ditty, MD   1 g at 09/22/16 0954  . docusate sodium (COLACE) capsule 100 mg  100 mg Oral BID Darci CurrentVincent J Costella, PA-C   100 mg at 09/22/16 0954  . famotidine (PEPCID) tablet  20 mg  20 mg Oral Daily PRN Karl Ito, MD   20 mg at 09/19/16 1844  . fentaNYL (SUBLIMAZE) injection 50-100 mcg  50-100 mcg Intravenous Q2H PRN Jeanella Craze, NP   50 mcg at 09/20/16 0454  . gabapentin (NEURONTIN) capsule 300 mg  300 mg Oral TID Alyson Ingles, PA-C   300 mg at 09/22/16 0954  . HYDROcodone-acetaminophen (NORCO) 7.5-325 MG per tablet 1 tablet  1 tablet Oral Q6H Darci Current Costella, PA-C   1 tablet at 09/22/16 0352  . lactated ringers infusion   Intravenous Continuous Jeanella Craze, NP 50 mL/hr at 09/20/16 1045    . menthol-cetylpyridinium (CEPACOL) lozenge 3 mg  1 lozenge Oral PRN Darci Current Costella, PA-C        Or  . phenol (CHLORASEPTIC) mouth spray 1 spray  1 spray Mouth/Throat PRN Vincent J Costella, PA-C      . methocarbamol (ROBAXIN) tablet 500 mg  500 mg Oral Q6H PRN Darci Current Costella, PA-C   500 mg at 09/19/16 1554   Or  . methocarbamol (ROBAXIN) 500 mg in dextrose 5 % 50 mL IVPB  500 mg Intravenous Q6H PRN Vincent J Costella, PA-C      . ondansetron (ZOFRAN) tablet 4 mg  4 mg Oral Q6H PRN Vincent J Costella, PA-C       Or  . ondansetron (ZOFRAN) injection 4 mg  4 mg Intravenous Q6H PRN Vincent J Costella, PA-C      . oxyCODONE (Oxy IR/ROXICODONE) immediate release tablet 5-10 mg  5-10 mg Oral Q3H PRN Alyson Ingles, PA-C   5 mg at 09/21/16 2208  . senna (SENOKOT) tablet 8.6 mg  1 tablet Oral BID Darci Current Costella, PA-C   8.6 mg at 09/22/16 0954  . senna-docusate (Senokot-S) tablet 1 tablet  1 tablet Oral QHS PRN Vincent J Costella, PA-C      . sodium phosphate (FLEET) 7-19 GM/118ML enema 1 enema  1 enema Rectal Once PRN Alyson Ingles, PA-C         Discharge Medications: Please see discharge summary for a list of discharge medications.  Relevant Imaging Results:  Relevant Lab Results:   Additional Information ss#[id number].  Cristobal Goldmann, LCSW

## 2016-09-22 NOTE — Care Management Note (Signed)
Case Management Note  Patient Details  Name: Donald CarnesJames R Greenley MRN: [id number] Date of Birth: 1956-04-26  Subjective/Objective:                    Action/Plan: Pt discharging to CIR today. No further needs per CM.   Expected Discharge Date:  09/22/16               Expected Discharge Plan:  IP Rehab Facility  In-House Referral:     Discharge planning Services  CM Consult  Post Acute Care Choice:    Choice offered to:     DME Arranged:    DME Agency:     HH Arranged:    HH Agency:     Status of Service:  Completed, signed off  If discussed at [employer] Meetings, dates discussed:    Additional Comments:  Kermit BaloKelli F Renso Swett, RN 09/22/2016, 3:31 PM

## 2016-09-22 NOTE — Progress Notes (Signed)
Erick Colace, MD Physician Addendum Physical Medicine and Rehabilitation  Consult Note Date of Service: 09/20/2016 8:48 AM  Related encounter: Admission (Current) from 09/18/2016 in [facility] 69M NEURO MEDICAL     Expand All Collapse All   [] Hide copied text      Physical Medicine and Rehabilitation Consult   Reason for Consult: Scoliosis Referring Physician:  Dr. Bevely Palmer   HPI: Donald Romero is a 61 y.o. male with history of HTN, CKD stage III, idiopathic scoliosis treated with T2- L2 fusion at age 68 and now with progressive worsening of back and RLE> LLE pain. He was found to have scoliosis and lumbosacral spondylosis with radiculopathy and elected to undergo  T 10- pelvis fixation and fusion with  L2 to S1 decompression by Dr. Bevely Palmer. Patient with prolonged surgery with acute on chronic renal failure, hypotension, ABLA and need for prolonged intubation. PCCM consulted for assistance and patient tolerated extubation on 3/13. PT evaluation done yesterday and patient limited by pain, weakness and tachycardia. CIR recommended for follow up therapy.   Patient notes that he has had chronic right hip pain. In addition, he has pain in his left hand due to gout. He has chronic left knee swelling but not pain. He feels numbness in his feet. Review of Systems  HENT: Negative for hearing loss and tinnitus.   Eyes: Negative for blurred vision.  Respiratory: Negative for cough and shortness of breath.   Cardiovascular: Negative for chest pain, palpitations and leg swelling.  Gastrointestinal: Positive for heartburn. Negative for constipation and nausea.  Genitourinary: Negative for frequency and urgency.  Musculoskeletal: Positive for back pain, joint pain (right hip) and myalgias.       Left wrist pain due to gout flare  Neurological: Negative for dizziness, tingling, sensory change, focal weakness and headaches.  Psychiatric/Behavioral: The patient is not  nervous/anxious and does not have insomnia.          Past Medical History:  Diagnosis Date  . Arthritis   . Gout   . Hypertension          Past Surgical History:  Procedure Laterality Date  . BACK SURGERY    . COLONOSCOPY    . TONSILLECTOMY    . WRIST SURGERY Right           Family History  Problem Relation Age of Onset  . Hypertension Mother   . Lung disease Father     asbestosis  . 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 Independent without AD PTA. Has a roommate (male) who can assist after discharge. He reports that he has been smoking.  He has been smoking about 0.50 packs per day. He has never used smokeless tobacco. He reports that he does not drink alcohol or use drugs.        Allergies  Allergen Reactions  . No Known Allergies           Medications Prior to Admission  Medication Sig Dispense Refill  . Acetaminophen (TYLENOL ARTHRITIS PAIN PO) Take 650-1,300 mg by mouth every 8 (eight) hours as needed (for pain.).    Marland Kitchen hydrALAZINE (APRESOLINE) 100 MG tablet Take 100 mg by mouth 3 (three) times daily.    . indomethacin (INDOCIN) 25 MG capsule Take 25 mg by mouth 3 (three) times daily as needed (gout flare ups).       Home: Home Living Family/patient expects to be  discharged to:: Private residence Living Arrangements: Non-relatives/Friends Available Help at Discharge: Friend(s), Available 24 hours/day ([place]) Type of Home: House Home Access: Stairs to enter [employer] of Steps: 1 Home Layout: One level Bathroom Shower/Tub: Tub/shower unit Home Equipment: Cane - single point  Functional History: Prior Function Level of Independence: Independent with assistive device(s) Comments: SPC for amb secondary to pain. Drives.  Functional Status:  Mobility: Bed Mobility Overal bed mobility: Needs Assistance Bed Mobility: Rolling, Sidelying to Sit,  Sit to Sidelying Rolling: Mod assist Sidelying to sit: Mod assist Sit to sidelying: Mod assist, HOB elevated General bed mobility comments: Educ and verbal cues for log roll technique. ModA for scooting hips forward to EOB; modA for maneuvering LEs from sit-to-sidelying Transfers Overall transfer level: Needs assistance Equipment used: 2 person hand held assist Transfers: Sit to/from Stand Sit to Stand: Mod assist, +2 physical assistance General transfer comment: Stood w/ modA +2, pt with significant pain during standing.   ADL:  Cognition: Cognition Overall Cognitive Status: Within Functional Limits for tasks assessed Orientation Level: Oriented X4 Cognition Arousal/Alertness: Awake/alert Behavior During Therapy: WFL for tasks assessed/performed Overall Cognitive Status: Within Functional Limits for tasks assessed  Blood pressure 126/68, pulse (!) 114, temperature 98.6 F (37 C), temperature source Oral, resp. rate 11, height 5\' 2"  (1.575 m), weight 74 kg (163 lb 2.3 oz), SpO2 96 %. Physical Exam  Nursing note reviewed. Constitutional: He is oriented to person, place, and time. He appears well-developed and well-nourished.  HENT:  Head: Normocephalic and atraumatic.  Mouth/Throat: Oropharynx is clear and moist.  Eyes: Conjunctivae and EOM are normal. Pupils are equal, round, and reactive to light.  Neck: Normal range of motion. Neck supple.  Cardiovascular: Regular rhythm.  Tachycardia present.   Respiratory: Effort normal and breath sounds normal.  GI: Soft. Bowel sounds are normal. He exhibits no distension. There is no tenderness.  Musculoskeletal: He exhibits edema (1+ edema left hand with warmth and tenderness).  Neurological: He is alert and oriented to person, place, and time.  Skin: Skin is warm and dry.  Psychiatric: He has a normal mood and affect. His behavior is normal. Thought content normal.  Decreased sensation, left L5 dermatome, right L4, L5, S1  dermatomes Pain with right hip range of motion Pain with left finger and wrist range of motion. No pain with left elbow range of motion Motor strength is 5/5 in the right deltoid, biceps, triceps, grip, 5/5 left deltoid, bicep, tricep 3 minus, finger flexion, extension, wrist flexion and extension secondary to pain. 3 minus bilateral hip flexors 4 bilateral, knee extensors 4 bilateral, ankle dorsiflexor, plantar flexor Lab Results Last 24 Hours       Results for orders placed or performed during the hospital encounter of 09/18/16 (from the past 24 hour(s))  Magnesium     Status: None   Collection Time: 09/20/16  3:29 AM  Result Value Ref Range   Magnesium 1.8 1.7 - 2.4 mg/dL  Phosphorus     Status: None   Collection Time: 09/20/16  3:29 AM  Result Value Ref Range   Phosphorus 4.2 2.5 - 4.6 mg/dL  Basic metabolic panel     Status: Abnormal   Collection Time: 09/20/16  3:29 AM  Result Value Ref Range   Sodium 135 135 - 145 mmol/L   Potassium 4.5 3.5 - 5.1 mmol/L   Chloride 105 101 - 111 mmol/L   CO2 24 22 - 32 mmol/L   Glucose, Bld 110 (H) 65 - 99  mg/dL   BUN 43 (H) 6 - 20 mg/dL   Creatinine, Ser 6.04 (H) 0.61 - 1.24 mg/dL   Calcium 8.1 (L) 8.9 - 10.3 mg/dL   GFR calc non Af Amer 17 (L) >60 mL/min   GFR calc Af Amer 20 (L) >60 mL/min   Anion gap 6 5 - 15  CBC     Status: Abnormal   Collection Time: 09/20/16  3:29 AM  Result Value Ref Range   WBC 12.6 (H) 4.0 - 10.5 K/uL   RBC 2.14 (L) 4.22 - 5.81 MIL/uL   Hemoglobin 6.3 (LL) 13.0 - 17.0 g/dL   HCT 54.0 (L) 98.1 - 19.1 %   MCV 88.3 78.0 - 100.0 fL   MCH 29.4 26.0 - 34.0 pg   MCHC 33.3 30.0 - 36.0 g/dL   RDW 47.8 29.5 - 62.1 %   Platelets 104 (L) 150 - 400 K/uL  Type and screen [facility]     Status: None   Collection Time: 09/20/16  5:29 AM  Result Value Ref Range   ABO/RH(D) B POS    Antibody Screen NEG    Sample Expiration 09/23/2016   Prepare RBC     Status: None    Collection Time: 09/20/16  5:29 AM  Result Value Ref Range   Order Confirmation ORDER PROCESSED BY BLOOD BANK       Imaging Results (Last 48 hours)  Dg Thoracolumabar Spine  Result Date: 09/18/2016 CLINICAL DATA:  Thoracolumbar spinal fixation EXAM: DG C-ARM 61-120 MIN; THORACOLUMBAR SPINE - 2 VIEW COMPARISON:  CT thoracic spine 09/18/2016 FINDINGS: Fluoroscopic images were unchanged during placement of bilateral thoracolumbar spinal rods and transpedicular screws. No immediate hardware complication. IMPRESSION: Intraoperative fluoroscopy during posterior spinal fixation. Electronically Signed   By: Deatra Robinson M.D.   On: 09/18/2016 17:11   Dg Chest Port 1 View  Result Date: 09/20/2016 CLINICAL DATA:  Respiratory failure. EXAM: PORTABLE CHEST 1 VIEW COMPARISON:  09/19/2016 . FINDINGS: Endotracheal tube and NG tube in stable position. Cardiomegaly with normal pulmonary vascularity. Right base atelectatic changes noted on today's exam. Persistent left base subsegmental atelectasis and/or pleuroparenchymal scarring. Small left pleural effusion cannot be excluded . IMPRESSION: 1.  Right base atelectatic changes noted on today's exam. 2. Persistent left base subsegmental atelectasis and/or pleuroparenchymal scarring. Small left pleural effusion cannot be excluded . 2. Cardiomegaly.  No pulmonary venous congestion. Electronically Signed   By: Maisie Fus  Register   On: 09/20/2016 07:55   Dg Chest Port 1 View  Result Date: 09/19/2016 CLINICAL DATA:  Bilateral flank pain EXAM: PORTABLE CHEST 1 VIEW COMPARISON:  09/18/2016 FINDINGS: Cardiomegaly. NG tube is in the stomach. Endotracheal tube is unchanged. Left lower lobe atelectasis. Right lung is clear. No effusions. IMPRESSION: Left lower lobe atelectasis. Cardiomegaly. Electronically Signed   By: Charlett Nose M.D.   On: 09/19/2016 07:20   Dg Chest Port 1 View  Result Date: 09/18/2016 CLINICAL DATA:  Endotracheal tube placement. Acute  respiratory failure. Spine surgery today. EXAM: PORTABLE CHEST 1 VIEW COMPARISON:  09/18/2016 CT FINDINGS: Thoracic spine CT from 09/18/2016. Endotracheal tube tip is 2.5 cm above the carina. Suspected mild atelectasis medially in both lower lobes. The lungs appear otherwise clear. Thoracolumbar rod fixator with laminar hooks, and lower thoracic and lumbar spine posterolateral rods with pedicle screw fixators. IMPRESSION: 1. Endotracheal tube tip: 2.5 cm above the carina, satisfactorily position. 2. Mild bibasilar atelectasis. Electronically Signed   By: Gaylyn Rong M.D.   On: 09/18/2016  17:58   Dg Abd Portable 1v  Result Date: 09/18/2016 CLINICAL DATA:  61 y/o  M; oral gastric tube placement. EXAM: PORTABLE ABDOMEN - 1 VIEW COMPARISON:  None available. FINDINGS: Nonobstructive bowel gas pattern. Enteric tube tip projects over the mid stomach. Spinal fusion mass and scoliosis rods, partially visualize, without apparent hardware related complication. Severe thoracolumbar dextrocurvature. IMPRESSION: Enteric tube tip projects over the mid stomach. Nonobstructive bowel gas pattern. Electronically Signed   By: Mitzi HansenLance  Furusawa-Stratton M.D.   On: 09/18/2016 22:50   Dg C-arm 1-60 Min  Result Date: 09/18/2016 CLINICAL DATA:  Thoracolumbar spinal fixation EXAM: DG C-ARM 61-120 MIN; THORACOLUMBAR SPINE - 2 VIEW COMPARISON:  CT thoracic spine 09/18/2016 FINDINGS: Fluoroscopic images were unchanged during placement of bilateral thoracolumbar spinal rods and transpedicular screws. No immediate hardware complication. IMPRESSION: Intraoperative fluoroscopy during posterior spinal fixation. Electronically Signed   By: Deatra RobinsonKevin  Herman M.D.   On: 09/18/2016 17:11     Assessment/Plan: Diagnosis: Bilateral lower extremity radiculopathy secondary to lumbar spinal stenosis/scoliosis status post decompression and fusion 09/18/2016 1. Does the need for close, 24 hr/day medical supervision in concert with the  patient's rehab needs make it unreasonable for this patient to be served in a less intensive setting? Yes 2. Co-Morbidities requiring supervision/potential complications: Right hip pain, probable away, left hand gout, severe postoperative anemia, acute on chronic renal failure 3. Due to bladder management, bowel management, safety, skin/wound care, disease management, medication administration, pain management and patient education, does the patient require 24 hr/day rehab nursing? Yes 4. Does the patient require coordinated care of a physician, rehab nurse, PT (1-2 hrs/day, 5 days/week) and OT (1-2 hrs/day, 5 days/week) to address physical and functional deficits in the context of the above medical diagnosis(es)? Yes Addressing deficits in the following areas: balance, endurance, locomotion, strength, transferring, bowel/bladder control, bathing, dressing, feeding, grooming, toileting and psychosocial support 5. Can the patient actively participate in an intensive therapy program of at least 3 hrs of therapy per day at least 5 days per week? Potentially 6. The potential for patient to make measurable gains while on inpatient rehab is excellent 7. Anticipated functional outcomes upon discharge from inpatient rehab are modified independent and supervision  with PT, modified independent and supervision with OT, n/a with SLP. 8. Estimated rehab length of stay to reach the above functional goals is: 10-14d 9. Does the patient have adequate social supports and living environment to accommodate these discharge functional goals? Yes 10. Anticipated D/C setting: Home 11. Anticipated post D/C treatments: HH therapy 12. Overall Rehab/Functional Prognosis: excellent  RECOMMENDATIONS: This patient's condition is appropriate for continued rehabilitative care in the following setting: CIR Patient has agreed to participate in recommended program. Yes Note that insurance prior authorization may be required for  reimbursement for recommended care.  Comment: May need another unit of PRBCs, given persistent tachycardia, may need nephrology evaluation. Acute on chronic renal failure Will need pain management on oral narcotics prior to rehabilitation admission   Jerene PitchLove, Pamela S, PA-C 09/20/2016    Revision History                                  Routing History

## 2016-09-22 NOTE — PMR Pre-admission (Signed)
PMR Admission Coordinator Pre-Admission Assessment  Patient: Donald Romero is an 61 y.o., male MRN: [id number] DOB: 1955/10/25 Height: 5\' 2"  (157.5 cm) Weight: 74 kg (163 lb 2.3 oz)              Insurance Information HMO:     PPO: X     PCP:      IPA:      80/20:      OTHER:  PRIMARYMonia Romero      Policy#: [id number]      Subscriber: Self CM Name: Donald Romero      Phone#: [phone]     Fax#: [phone] (has EPIC Access) Pre-Cert#: [id number]      Employer: Full Time Benefits:  Phone #: [phone]     Name: Automated Line Eff. Date: 07/10/14     Deduct: $3,000      Out of Pocket Max: $7,350      Life Max: N/A CIR: 70%/30%      SNF: 70%/30% Outpatient: PT/OT     Co-Pay: $40 per visit, 90 visit limit  Home Health: 70%      Co-Pay: 30% DME: 70%     Co-Pay: 30% Providers: in network   Medicaid Application Date:       Case Manager:  Disability Application Date:       Case Worker:   Emergency Contact Information Contact Information    Name Relation Home Work Mobile   Donald Romero   [phone]   Donald Romero   [phone]     Current Medical History  Patient Admitting Diagnosis: Bilateral lower extremity radiculopathy secondary to lumbar spinal stenosis/scoliosis status post decompression and fusion 09/18/2016.  History of Present Illness: Donald Romero a 60 y.o.malewith history of HTN, CKD stage III, idiopathic scoliosis treated with T2- L2 fusion at age 58 and now with progressive worsening of back and RLE>LLE pain. He was found to have scoliosis and lumbosacral spondylosis with radiculopathy and elected to undergo T 10- pelvis fixation and fusion with L2 to S1 decompression by Dr. Bevely Palmer. Patient with prolonged surgery with acute on chronic renal failure, hypotension, ABLA and need for prolonged intubation. PCCM consulted for assistance and patient tolerated extubation on 3/13. He has had flare up of gout in left hand and was started on cholchine.  He was  transfused with one unit PRBC for drop in H/H to 6.3/18.9. Issues with tachycardia felt to be related to pain and anemia per TRH.  Therapy ongoing and patient noted to have decreased WB on LLE, decreased balance with posterior lean and weakness affecting mobility and ability to carry out ADL tasks. CIR recommended for follow up therapy.       Past Medical History  Past Medical History:  Diagnosis Date  . Arthritis   . Gout   . Hypertension     Family History  family history includes Hypertension in his mother; Lung disease in his father; Thyroid cancer in his brother.  Prior Rehab/Hospitalizations:  Has the patient had major surgery during 100 days prior to admission? No  Current Medications   Current Facility-Administered Medications:  .  0.9 %  sodium chloride infusion, , Intravenous, Once, Loura Halt Ditty, MD .  acetaminophen (TYLENOL) tablet 650 mg, 650 mg, Oral, Q4H PRN, 650 mg at 09/22/16 0352 **OR** acetaminophen (TYLENOL) suppository 650 mg, 650 mg, Rectal, Q4H PRN, Darci Current Costella, PA-C .  alum & mag hydroxide-simeth (MAALOX/MYLANTA) 200-200-20 MG/5ML suspension 30 mL, 30 mL, Oral, Q6H PRN, Alyson Ingles,  PA-C, 30 mL at 09/19/16 1556 .  bisacodyl (DULCOLAX) suppository 10 mg, 10 mg, Rectal, Daily PRN, Darci Current Costella, PA-C .  ceFAZolin (ANCEF) IVPB 1 g/50 mL premix, 1 g, Intravenous, Q12H, Loura Halt Ditty, MD, 1 g at 09/22/16 0954 .  docusate sodium (COLACE) capsule 100 mg, 100 mg, Oral, BID, Vincent J Costella, PA-C, 100 mg at 09/22/16 0954 .  famotidine (PEPCID) tablet 20 mg, 20 mg, Oral, Daily PRN, Karl Ito, MD, 20 mg at 09/19/16 1844 .  fentaNYL (SUBLIMAZE) injection 50-100 mcg, 50-100 mcg, Intravenous, Q2H PRN, Jeanella Craze, NP, 50 mcg at 09/20/16 0454 .  gabapentin (NEURONTIN) capsule 300 mg, 300 mg, Oral, TID, Darci Current Costella, PA-C, 300 mg at 09/22/16 0954 .  HYDROcodone-acetaminophen (NORCO) 7.5-325 MG per tablet 1 tablet, 1 tablet,  Oral, Q6H, Vincent J Costella, PA-C, 1 tablet at 09/22/16 0352 .  lactated ringers infusion, , Intravenous, Continuous, Jeanella Craze, NP, Last Rate: 50 mL/hr at 09/20/16 1045 .  menthol-cetylpyridinium (CEPACOL) lozenge 3 mg, 1 lozenge, Oral, PRN **OR** phenol (CHLORASEPTIC) mouth spray 1 spray, 1 spray, Mouth/Throat, PRN, Darci Current Costella, PA-C .  methocarbamol (ROBAXIN) tablet 500 mg, 500 mg, Oral, Q6H PRN, 500 mg at 09/19/16 1554 **OR** methocarbamol (ROBAXIN) 500 mg in dextrose 5 % 50 mL IVPB, 500 mg, Intravenous, Q6H PRN, Vincent J Costella, PA-C .  ondansetron (ZOFRAN) tablet 4 mg, 4 mg, Oral, Q6H PRN **OR** ondansetron (ZOFRAN) injection 4 mg, 4 mg, Intravenous, Q6H PRN, Darci Current Costella, PA-C .  oxyCODONE (Oxy IR/ROXICODONE) immediate release tablet 5-10 mg, 5-10 mg, Oral, Q3H PRN, Darci Current Costella, PA-C, 5 mg at 09/21/16 2208 .  senna (SENOKOT) tablet 8.6 mg, 1 tablet, Oral, BID, Vincent J Costella, PA-C, 8.6 mg at 09/22/16 0954 .  senna-docusate (Senokot-S) tablet 1 tablet, 1 tablet, Oral, QHS PRN, Darci Current Costella, PA-C .  sodium phosphate (FLEET) 7-19 GM/118ML enema 1 enema, 1 enema, Rectal, Once PRN, Alyson Ingles, PA-C  Patients Current Diet: Diet heart healthy/carb modified Room service appropriate? Yes; Fluid consistency: Thin Diet - low sodium heart healthy  Precautions / Restrictions Precautions Precautions: Fall, Back Precaution Booklet Issued: No Precaution Comments: verbally reviewed precautions Spinal Brace: Lumbar corset, Applied in sitting position Restrictions Weight Bearing Restrictions: No Other Position/Activity Restrictions: gout in left hand and left knee   Has the patient had 2 or more falls or a fall with injury in the past year?No, 1 fall without injury   Prior Activity Level Community (5-7x/wk): Prior to admission patient was fully independent working full time taht required him to climb stairs, adjust equipment, and he also did yard work.     Home Assistive Devices / Equipment Home Assistive Devices/Equipment: Cane (specify quad or straight) Home Equipment: Cane - single point  Prior Device Use: Indicate devices/aids used by the patient prior to current illness, exacerbation or injury? single point cane just prior to admission due to decline   Prior Functional Level Prior Function Level of Independence: Independent Comments: SPC for amb secondary to pain. Drives.   Self Care: Did the patient need help bathing, dressing, using the toilet or eating? Independent  Indoor Mobility: Did the patient need assistance with walking from room to room (with or without device)? Independent  Stairs: Did the patient need assistance with internal or external stairs (with or without device)? Independent  Functional Cognition: Did the patient need help planning regular tasks such as shopping or remembering to take medications? Independent  Current Functional  Level Cognition  Overall Cognitive Status: Within Functional Limits for tasks assessed Orientation Level: Oriented X4    Extremity Assessment (includes Sensation/Coordination)  Upper Extremity Assessment: LUE deficits/detail LUE Deficits / Details: L hand gout with edema and pain on OT eval affecting grip strength. able to hold onto rw with B hands with R grip stronger than L grip. LUE Coordination: decreased fine motor, decreased gross motor  Lower Extremity Assessment: Defer to PT evaluation    ADLs  Overall ADL's : Needs assistance/impaired Eating/Feeding: Set up, Sitting Grooming: Set up, Sitting Upper Body Bathing: Moderate assistance, Sitting Lower Body Bathing: Moderate assistance, +2 for physical assistance, Cueing for back precautions, Sit to/from stand Upper Body Dressing : Moderate assistance, Sitting Lower Body Dressing: Moderate assistance, +2 for physical assistance, Sit to/from stand, Cueing for back precautions Toilet Transfer: +2 for physical assistance,  Stand-pivot, BSC, RW, Moderate assistance, Minimal assistance Toileting- Clothing Manipulation and Hygiene: Moderate assistance, Sit to/from stand Functional mobility during ADLs: Minimal assistance, Moderate assistance, +2 for physical assistance, Rolling walker General ADL Comments: Pt received in recliner. Initially completed pivotal steps to bed and side steps along EOB. After brief seated rest break pt requesting to walk some more. Ambulated forward and back from EOB to couch. Then completed bed mobility. Reviewed BLT precautions and began education on strategies and AE for LB ADLs and pericare.     Mobility  Overal bed mobility: Needs Assistance Bed Mobility: Rolling, Sit to Sidelying Rolling: Min assist Sidelying to sit: Mod assist Sit to sidelying: Mod assist General bed mobility comments: OOB in recliner when PT arrives    Transfers  Overall transfer level: Needs assistance Equipment used: Rolling walker (2 wheeled) Transfers: Sit to/from Stand Sit to Stand: Min guard Stand pivot transfers: Min assist, Mod assist, +2 physical assistance General transfer comment: Min A with cues for hand placement and assistance to place RUE on recliner to assist with standing    Ambulation / Gait / Stairs / Wheelchair Mobility  Ambulation/Gait Ambulation/Gait assistance: Min assist, +2 safety/equipment Ambulation Distance (Feet): 50 Feet Assistive device: Rolling walker (2 wheeled) Gait Pattern/deviations: Step-to pattern, Decreased step length - left, Decreased step length - right General Gait Details: decreased weight shift to left, assistance to maintain weight bearing through LLE with stance.  Gait velocity: decreased Gait velocity interpretation: Below normal speed for age/gender    Posture / Balance Dynamic Sitting Balance Sitting balance - Comments: fair static balance, assist for sitting balance with dynamic activity (posterior lean) Balance Overall balance assessment: Needs  assistance Sitting-balance support: No upper extremity supported, Feet supported Sitting balance-Leahy Scale: Fair Sitting balance - Comments: fair static balance, assist for sitting balance with dynamic activity (posterior lean) Postural control: Posterior lean Standing balance support: Bilateral upper extremity supported Standing balance-Leahy Scale: Poor    Special needs/care consideration BiPAP/CPAP: No CPM: No Continuous Drip IV: No Dialysis: No         Life Vest: No Oxygen: No Special Bed: No Trach Size: No Wound Vac (area): No       Skin: WDL                               Bowel mgmt: 09/18/16, PTA per pt report constipation needs to be addresed  Bladder mgmt: Continent  Diabetic mgmt: Reports no, but is on a Carb Mod diet so education may need to be completed by RN.      Previous Home Environment  Living Arrangements: Non-relatives/Friends Available Help at Discharge: Friend(s), Available 24 hours/day Type of Home: House Home Layout: One level Home Access: Stairs to enter Secretary/administrator of Steps: 1 Bathroom Shower/Tub: Tub/shower unit Home Care Services: No  Discharge Living Setting Plans for Discharge Living Setting: Patient's home, House, Lives with (comment) (friend ) Type of Home at Discharge: House Discharge Home Layout: One level Discharge Home Access: Stairs to enter Entrance Stairs-Rails: Can reach both Entrance Stairs-Number of Steps: 2, porch, then 1 Discharge Bathroom Shower/Tub: Tub/shower unit, Door Discharge Bathroom Toilet: Standard Discharge Bathroom Accessibility: Yes How Accessible: Accessible via walker Does the patient have any problems obtaining your medications?: No  Social/Family/Support Systems Patient Roles: Other (Comment) (Friend) Contact Information: FriendEarl Many [phone] Anticipated Caregiver: Waldron Session Anticipated Caregiver's Contact Information: see above  Ability/Limitations of Caregiver: None Caregiver  Availability: 24/7 Discharge Plan Discussed with Primary Caregiver: Yes Is Caregiver In Agreement with Plan?: Yes Does Caregiver/Family have Issues with Lodging/Transportation while Pt is in Rehab?: No  Goals/Additional Needs Patient/Family Goal for Rehab: PT/OT Mod I- Supervision  Expected length of stay: 10-14 days  Cultural Considerations: None Dietary Needs: Heart Healthy/Carb Mod. Equipment Needs: TBD Special Service Needs: None Additional Information: None Pt/Family Agrees to Admission and willing to participate: Yes Program Orientation Provided & Reviewed with Pt/Caregiver Including Roles  & Responsibilities: Yes Additional Information Needs: Pt with corset back brace Information Needs to be Provided By: Team FYI   Decrease burden of Care through IP rehab admission: No  Possible need for SNF placement upon discharge: No  Patient Condition: This patient's medical and functional status has changed since the consult dated: 09/20/16 at 0943 in which the Rehabilitation Physician determined and documented that the patient's condition is appropriate for intensive rehabilitative care in an inpatient rehabilitation facility. See "History of Present Illness" (above) for medical update. Functional changes are: Mod A +2 transfers. Patient's medical and functional status update has been discussed with the Rehabilitation physician and patient remains appropriate for inpatient rehabilitation. Will admit to inpatient rehab today.  Preadmission Screen Completed By:  Fae Pippin, 09/22/2016 1:05 PM ______________________________________________________________________   Discussed status with Dr. Riley Kill on 09/22/16 at 1315 and received telephone approval for admission today.  Admission Coordinator:  Fae Pippin, time 1315/Date 09/22/16

## 2016-09-22 NOTE — Progress Notes (Addendum)
At 1640 report was completed with RN Doree FudgeLuz on rehab unit/ CIR. Pt will transfer to room 4MW05.   At 1715 D/c instructions from acute care neuro unit (room [id number]) reviewed with pt and his significant other. Copy of instructions and script for percocet given to pt, other scripts sent in to pt's home pharmacy electronically, pt instructed that other instructions and or scripts will be given at time of discharge from rehab unit. Pt and sig other verbalized understanding. Pt d/c'd with belongings, via wheelchair escorted by unit NT.

## 2016-09-23 ENCOUNTER — Inpatient Hospital Stay (HOSPITAL_COMMUNITY): Payer: 59 | Admitting: Occupational Therapy

## 2016-09-23 ENCOUNTER — Inpatient Hospital Stay (HOSPITAL_COMMUNITY): Payer: 59 | Admitting: Physical Therapy

## 2016-09-23 DIAGNOSIS — M109 Gout, unspecified: Secondary | ICD-10-CM

## 2016-09-23 LAB — COMPREHENSIVE METABOLIC PANEL
ALK PHOS: 54 U/L (ref 38–126)
ALT: 15 U/L — ABNORMAL LOW (ref 17–63)
ANION GAP: 10 (ref 5–15)
AST: 67 U/L — ABNORMAL HIGH (ref 15–41)
Albumin: 2.6 g/dL — ABNORMAL LOW (ref 3.5–5.0)
BUN: 20 mg/dL (ref 6–20)
CO2: 25 mmol/L (ref 22–32)
Calcium: 8.8 mg/dL — ABNORMAL LOW (ref 8.9–10.3)
Chloride: 102 mmol/L (ref 101–111)
Creatinine, Ser: 1.68 mg/dL — ABNORMAL HIGH (ref 0.61–1.24)
GFR calc non Af Amer: 43 mL/min — ABNORMAL LOW (ref >60)
GFR, EST AFRICAN AMERICAN: 49 mL/min — AB (ref >60)
Glucose, Bld: 126 mg/dL — ABNORMAL HIGH (ref 65–99)
Potassium: 4.3 mmol/L (ref 3.5–5.1)
SODIUM: 137 mmol/L (ref 135–145)
TOTAL PROTEIN: 5.3 g/dL — AB (ref 6.5–8.1)
Total Bilirubin: 0.3 mg/dL (ref 0.3–1.2)

## 2016-09-23 LAB — CBC WITH DIFFERENTIAL/PLATELET
Basophils Absolute: 0 10*3/uL (ref 0.0–0.1)
Basophils Relative: 0 %
EOS ABS: 0.3 10*3/uL (ref 0.0–0.7)
EOS PCT: 3 %
HCT: 26.9 % — ABNORMAL LOW (ref 39.0–52.0)
HEMOGLOBIN: 8.9 g/dL — AB (ref 13.0–17.0)
Lymphocytes Relative: 17 %
Lymphs Abs: 1.6 10*3/uL (ref 0.7–4.0)
MCH: 29.4 pg (ref 26.0–34.0)
MCHC: 33.1 g/dL (ref 30.0–36.0)
MCV: 88.8 fL (ref 78.0–100.0)
MONO ABS: 0.8 10*3/uL (ref 0.1–1.0)
MONOS PCT: 8 %
Neutro Abs: 6.5 10*3/uL (ref 1.7–7.7)
Neutrophils Relative %: 72 %
PLATELETS: 186 10*3/uL (ref 150–400)
RBC: 3.03 MIL/uL — ABNORMAL LOW (ref 4.22–5.81)
RDW: 14.3 % (ref 11.5–15.5)
WBC: 9.2 10*3/uL (ref 4.0–10.5)

## 2016-09-23 MED ORDER — COLCHICINE 0.6 MG PO TABS
0.6000 mg | ORAL_TABLET | Freq: Every day | ORAL | Status: DC
  Administered 2016-09-23 – 2016-09-27 (×5): 0.6 mg via ORAL
  Filled 2016-09-23 (×5): qty 1

## 2016-09-23 MED ORDER — PREDNISONE 20 MG PO TABS
40.0000 mg | ORAL_TABLET | Freq: Every day | ORAL | Status: AC
  Administered 2016-09-23 – 2016-09-24 (×2): 40 mg via ORAL
  Filled 2016-09-23 (×2): qty 2

## 2016-09-23 NOTE — Evaluation (Signed)
Occupational Therapy Assessment and Plan  Patient Details  Name: Donald Romero MRN: [id number] Date of Birth: 1955-10-01  OT Diagnosis: abnormal posture, muscle weakness (generalized), pain in joint and swelling of limb Rehab Potential: Rehab Potential (ACUTE ONLY): Excellent ELOS: 14-16 days   Today's Date: 09/23/2016 OT Individual Time: 9381-0175 and 1025-8527 OT Individual Time Calculation (min): 60 min  And 35 min  Problem List:  Patient Active Problem List   Diagnosis Date Noted  . Scoliosis of lumbar spine 09/22/2016  . Acute respiratory failure with hypoxia ([place])   . Scoliosis 09/18/2016  . Postoperative respiratory failure ([place]) 09/18/2016  . Sinus tachycardia 09/18/2016  . Acute post-operative pain 09/18/2016  . Goals of care, counseling/discussion 09/18/2016    Past Medical History:  Past Medical History:  Diagnosis Date  . Arthritis   . Gout   . Hypertension    Past Surgical History:  Past Surgical History:  Procedure Laterality Date  . BACK SURGERY    . COLONOSCOPY    . TONSILLECTOMY    . WRIST SURGERY Right     Assessment & Plan Clinical Impression: Donald Romero a 61 y.o.malewith history of HTN, CKD stage III, idiopathic scoliosis treated with T2- L2 fusion at age 61 and now with progressive worsening of back and RLE>LLE pain. He was found to have scoliosis and lumbosacral spondylosis with radiculopathy and elected to undergo T 10- pelvis fixation and fusion with L2 to S1 decompression by Dr. Cyndy Freeze. Patient with prolonged surgery with acute on chronic renal failure, hypotension, ABLA and need for prolonged intubation. PCCM consulted for assistance and patient tolerated extubation on 3/13. He has had flare up of gout in left hand and was started on cholchine    Patient currently requires mod with basic self-care skills secondary to muscle weakness, decreased cardiorespiratoy endurance, decreased coordination and decreased standing balance, decreased  postural control and difficulty maintaining precautions.  Prior to hospitalization, patient could complete BADLs with modified independent .  Patient will benefit from skilled intervention to increase independence with basic self-care skills prior to discharge home with care partner.  Anticipate patient will require intermittent supervision and follow up home health.  OT - End of Session Endurance Deficit: Yes OT Assessment Rehab Potential (ACUTE ONLY): Excellent Barriers to Discharge: Other (comment) (N/A) OT Patient demonstrates impairments in the following area(s): Balance;Safety;Edema;Endurance;Motor OT Basic ADL's Functional Problem(s): Grooming;Bathing;Dressing;Toileting OT Advanced ADL's Functional Problem(s): Simple Meal Preparation;Full Meal Preparation;Laundry OT Transfers Functional Problem(s): Toilet;Tub/Shower OT Additional Impairment(s): Fuctional Use of Upper Extremity OT Plan OT Intensity: Minimum of 1-2 x/day, 45 to 90 minutes OT Frequency: 5 out of 7 days OT Duration/Estimated Length of Stay: 14-16 days OT Treatment/Interventions: Discharge planning;Pain management;Self Care/advanced ADL retraining;Therapeutic Activities;UE/LE Coordination activities;Functional mobility training;Patient/family education;Therapeutic Exercise;DME/adaptive equipment instruction;Neuromuscular re-education;Psychosocial support;UE/LE Strength taining/ROM OT Self Feeding Anticipated Outcome(s): N/A OT Basic Self-Care Anticipated Outcome(s): Mod I OT Toileting Anticipated Outcome(s): Mod I  OT Bathroom Transfers Anticipated Outcome(s): Supervision-Mod I  OT Recommendation Recommendations for Other Services: Therapeutic Recreation consult Therapeutic Recreation Interventions: Pet therapy;Kitchen group Patient destination: Home Follow Up Recommendations: Home health OT Equipment Recommended: 3 in 1 bedside comode;To be determined   Skilled Therapeutic Intervention Skilled OT session completed  with focus on initial evaluation, adherence to back precautions, education on OT role/POC, and establishment of patient-centered goals. Pt was sitting in w/c at time of arrival with girlfriend present, agreeable to tx. 3/3 recall of back precautions. Pt completed bathing/dressing w/c level at sink with overall Mod A for  bending demands. Pt unable to utilize figure 4 position for LB ADLs and therefore required assist. Mod A for sit<stand and maintaining dynamic balance. Afterwards had pt practice stand pivot transfer to raised toilet with Mod A and instruction on technique. Pt required Min A to doff/don brace during session with instruction. At end of session pt was left in w/c with girlfriend and all needs within reach.   2nd Session 1:1 tx (35 min) Pt was sitting in w/c at time of arrival, agreeable to session. Initiated training on AE for LB ADLs, including using reacher and sock aide. Pt was able to simulate LB dressing with use of theraband and reacher, standing as needed with RW and Mod A. Education provided on sock aide with pt able to return carryover of education with practice. Extra time required due to sensation/coordination deficits with L UE. At end of session pt reported wanting to return to bed. Stand pivot completed with Mod A back to bed. Brace removed and pt logrolled for sit<supine. He was left with all needs within reach at time of departure.   OT Evaluation Precautions/Restrictions  Precautions Precautions: Fall;Back;Other (comment) Precaution Comments:  (gout/pain in L UE) Required Braces or Orthoses: Spinal Brace Spinal Brace: Lumbar corset;Applied in sitting position Restrictions Weight Bearing Restrictions: No General Chart Reviewed: Yes Family/Caregiver Present: Yes Vital Signs  Pain   Home Living/Prior Functioning Home Living Family/patient expects to be discharged to:: Private residence Living Arrangements: Non-relatives/Friends Available Help at Discharge:  Friend(s), Available 24 hours/day Type of Home: House Home Access: Stairs to enter [employer] of Steps: 2 steps to porch with B rails, 1 threshold step into house Home Layout: One level Bathroom Shower/Tub: Chiropodist: Standard (per pt "very low toilet") Bathroom Accessibility: Yes  Lives With: Significant other IADL History Homemaking Responsibilities: Yes Meal Prep Responsibility: Primary Laundry Responsibility: Primary Occupation: Full time employment Type of Occupation: Dietitian  Leisure and Hobbies: Fishing Prior Function Level of Independence: Independent with gait, Independent with transfers, Independent with basic ADLs, Independent with homemaking with ambulation  Able to Take Stairs?: Yes Driving: Yes ADL ADL ADL Comments: Please see functional navigator for ADL status Vision/Perception  Vision- History Baseline Vision/History: Wears glasses Wears Glasses: Reading only Patient Visual Report: No change from baseline Vision- Assessment Vision Assessment?: No apparent visual deficits Praxis Praxis-Other Comments:  (during bathing/dressing completion)  Cognition Overall Cognitive Status: Within Functional Limits for tasks assessed Arousal/Alertness: Awake/alert Orientation Level: Person;Place;Situation Person: Oriented Place: Oriented Situation: Oriented Year: 2018 Month: March Day of Week: Correct Memory: Appears intact Immediate Memory Recall: Sock;Blue;Bed Memory Recall: Sock;Blue;Bed Memory Recall Sock: Without Cue Memory Recall Blue: Without Cue Memory Recall Bed: Without Cue Attention: Sustained Sustained Attention: Appears intact Awareness: Appears intact Problem Solving: Appears intact Safety/Judgment: Appears intact Sensation Sensation Light Touch: Impaired Detail Light Touch Impaired Details: Impaired LUE Stereognosis: Not tested Hot/Cold: Appears Intact Proprioception: Impaired  Detail Proprioception Impaired Details: Impaired LUE Coordination Gross Motor Movements are Fluid and Coordinated: Yes Fine Motor Movements are Fluid and Coordinated: No (coordination deficits L UE) Finger Nose Finger Test: dysmetria L UE Motor  Motor Motor: Other (comment) Motor - Skilled Clinical Observations: global weakness Mobility  Transfers Sit to Stand: 3: Mod assist Sit to Stand Details: Verbal cues for precautions/safety;Verbal cues for technique;Verbal cues for safe use of DME/AE Stand to Sit: 3: Mod assist  Trunk/Postural Assessment  Cervical Assessment Cervical Assessment: Within Functional Limits Thoracic Assessment Thoracic Assessment: Exceptions to [facility] (N/A due to  back precautions) Lumbar Assessment Lumbar Assessment: Exceptions to [facility] (N/A due to back precautions) Postural Control Postural Control: Deficits on evaluation  Balance Balance Balance Assessed: Yes Dynamic Sitting Balance Dynamic Sitting - Level of Assistance: 4: Min assist Dynamic Standing Balance Dynamic Standing - Balance Support: During functional activity (ADLs) Dynamic Standing - Level of Assistance: 3: Mod assist Extremity/Trunk Assessment RUE Assessment RUE Assessment: Exceptions to [facility] (3/5 proximal to distal) LUE Assessment LUE Assessment: Exceptions to [facility] (3/5 proximal to distal, external rotation deficits)  See Function Navigator for Current Functional Status.   Refer to Care Plan for Long Term Goals  Recommendations for other services: Therapeutic Recreation  Pet therapy, Kitchen group, Stress management and Outing/community reintegration   Discharge Criteria: Patient will be discharged from OT if patient refuses treatment 3 consecutive times without medical reason, if treatment goals not met, if there is a change in medical status, if patient makes no progress towards goals or if patient is discharged from hospital.  The above assessment, treatment plan, treatment alternatives  and goals were discussed and mutually agreed upon: by patient  Skeet Simmer 09/23/2016, 8:03 PM

## 2016-09-23 NOTE — Evaluation (Signed)
Physical Therapy Assessment and Plan  Patient Details  Name: Donald Romero MRN: [id number] Date of Birth: May 24, 1956  PT Diagnosis: Difficulty walking, Impaired sensation and Muscle weakness Rehab Potential: Fair ELOS: 12 to 14 days   Today's Date: 09/23/2016 PT Individual Time: 0800-0900 PT Individual Time Calculation (min): 60 min    Problem List:  Patient Active Problem List   Diagnosis Date Noted  . Scoliosis of lumbar spine 09/22/2016  . Acute respiratory failure with hypoxia ([place])   . Scoliosis 09/18/2016  . Postoperative respiratory failure ([place]) 09/18/2016  . Sinus tachycardia 09/18/2016  . Acute post-operative pain 09/18/2016  . Goals of care, counseling/discussion 09/18/2016    Past Medical History:  Past Medical History:  Diagnosis Date  . Arthritis   . Gout   . Hypertension    Past Surgical History:  Past Surgical History:  Procedure Laterality Date  . BACK SURGERY    . COLONOSCOPY    . TONSILLECTOMY    . WRIST SURGERY Right     Assessment & Plan Clinical Impression: Patient is a 61 y.o.malewith history of HTN, CKD stage III, idiopathic scoliosis treated with T2- L2 fusion at age 31 and now with progressive worsening of back and RLE>LLE pain. He was found to have scoliosis and lumbosacral spondylosis with radiculopathy and elected to undergo T 10- pelvis fixation and fusion with L2 to S1 decompression by Dr. Cyndy Freeze. Patient with prolonged surgery with acute on chronic renal failure, hypotension, ABLA and need for prolonged intubation. PCCM consulted for assistance and patient tolerated extubation on 3/13. He has had flare up of gout in left hand and was started on cholchine   He was transfused with one unit PRBC for drop in H/H to 6.3/18.9. Issues with tachycardia felt to be related to pain and anemia per TRH.   Therapy ongoing and patient noted to have decreased WB on LLE, decreased balance with posterior lean and weakness affecting mobility and ability to  carry out ADL tasks. CIR recommended for follow up therapy.   Patient transferred to CIR on 09/22/2016 .   Patient currently requires mod with mobility secondary to muscle weakness and decreased cardiorespiratoy endurance.  Prior to hospitalization, patient was modified independent  with mobility and lived with Significant other in a House home.  Home access is 2 steps to porch with B rails, 1 threshold step into houseStairs to enter.  Patient will benefit from skilled PT intervention to maximize safe functional mobility, minimize fall risk and decrease caregiver burden for planned discharge home with 24 hour supervision.  Anticipate patient will benefit from follow up [place] at discharge.  PT - End of Session Activity Tolerance: Tolerates 30+ min activity with multiple rests Endurance Deficit: Yes PT Assessment Rehab Potential (ACUTE/IP ONLY): Fair Barriers to Discharge: Inaccessible home environment PT Patient demonstrates impairments in the following area(s): Balance;Motor;Endurance;Pain PT Transfers Functional Problem(s): Bed Mobility;Bed to Chair;Car PT Locomotion Functional Problem(s): Ambulation;Stairs PT Plan PT Intensity: Minimum of 1-2 x/day ,45 to 90 minutes PT Frequency: 5 out of 7 days PT Duration Estimated Length of Stay: 12 to 14 days PT Treatment/Interventions: Ambulation/gait training;Balance/vestibular training;DME/adaptive equipment instruction;Discharge planning;Functional mobility training;Neuromuscular re-education;Therapeutic Activities;UE/LE Strength taining/ROM;UE/LE Coordination activities;Therapeutic Exercise;Stair training;Splinting/orthotics;Patient/family education;Pain management PT Transfers Anticipated Outcome(s): mod I transfers PT Locomotion Anticipated Outcome(s): mod I gait, S stairs PT Recommendation Recommendations for Other Services: Therapeutic Recreation consult Follow Up Recommendations: Home health PT Patient destination: Home Equipment Recommended:  To be determined  Skilled Therapeutic Intervention PT evaluation completed and treatment  plan initiated. Pt requires increased time for transfers secondary to pain left hand and knee. Pt's transfers improved throughout treatment session from mod A to min A with rolling walker and verbal cues. Pt returned to room following treatment and left sitting up in w/c with call bell within reach.   PT Evaluation Precautions/Restrictions Precautions Precautions: Fall;Back Required Braces or Orthoses: Spinal Brace Spinal Brace: Lumbar corset;Applied in sitting position Restrictions Weight Bearing Restrictions: No Other Position/Activity Restrictions: gout L hand and L knee  General Chart Reviewed: Yes Family/Caregiver Present: Yes Vital Signs Pain Pt c/o no pain at rest, pt c/o mod pain with activity L knee.   Home Living/Prior Functioning Home Living Available Help at Discharge: Friend(s);Available 24 hours/day Type of Home: House Home Access: Stairs to enter [employer] of Steps: 2 steps to porch with B rails, 1 threshold step into house Home Layout: One level  Lives With: Significant other Prior Function Level of Independence: Independent with gait;Independent with transfers (utilized a st cane as needed, limited at times by back pain)  Able to Take Stairs?: Yes Driving: Yes Vision/Perception  As per OT evaluation.  Cognition Overall Cognitive Status: Within Functional Limits for tasks assessed Orientation Level: Oriented X4 Memory: Appears intact Awareness: Appears intact Problem Solving: Appears intact Safety/Judgment: Appears intact Sensation Sensation Light Touch: Impaired by gross assessment (impaired B LEs R worse than L ) Coordination Gross Motor Movements are Fluid and Coordinated: Yes Mobility Bed Mobility Bed Mobility: Rolling Right Rolling Right: 4: Min assist;With rail Transfers Transfers: Yes Sit to Stand: 3: Mod assist Stand to Sit: 3: Mod  assist Stand Pivot Transfers: 3: Mod assist Locomotion  Ambulation Ambulation: Yes Ambulation/Gait Assistance: 4: Min assist Ambulation Distance (Feet): 25 Feet Assistive device: Rolling walker Stairs / Additional Locomotion Stairs: Yes Stairs Assistance: 3: Mod assist Stair Management Technique: Two rails Number of Stairs: 1 Height of Stairs: 6 Wheelchair Mobility Wheelchair Mobility: Yes Wheelchair Assistance: 5: Supervision Wheelchair Propulsion: Right upper extremity;Right lower extremity Distance: 10  Trunk/Postural Assessment  Cervical Assessment Cervical Assessment: Exceptions to [facility] Thoracic Assessment Thoracic Assessment: Exceptions to [facility] Lumbar Assessment Lumbar Assessment: Exceptions to [facility] Postural Control Postural Control: Deficits on evaluation  Balance Balance Balance Assessed: Yes Static Sitting Balance Static Sitting - Balance Support: Feet supported;Bilateral upper extremity supported Static Sitting - Level of Assistance: 5: Stand by assistance Static Sitting - Comment/# of Minutes:  (tends to lean posteriorly) Static Standing Balance Static Standing - Balance Support: During functional activity Static Standing - Level of Assistance: 4: Min assist Dynamic Standing Balance Dynamic Standing - Balance Support: During functional activity Dynamic Standing - Level of Assistance: 3: Mod assist Extremity Assessment B UEs as per OT evaluation.   RLE Assessment RLE Assessment: Exceptions to [facility] RLE AROM (degrees) Overall AROM Right Lower Extremity: Within functional limits for tasks assessed RLE Strength RLE Overall Strength: Deficits RLE Overall Strength Comments: grossly 3/5 LLE Assessment LLE Assessment: Exceptions to WFL LLE AROM (degrees) Overall AROM Left Lower Extremity: Deficits;Due to pain LLE Strength LLE Overall Strength: Deficits;Due to pain   See Function Navigator for Current Functional Status.   Refer to Care Plan for Long Term  Goals  Recommendations for other services: None   Discharge Criteria: Patient will be discharged from PT if patient refuses treatment 3 consecutive times without medical reason, if treatment goals not met, if there is a change in medical status, if patient makes no progress towards goals or if patient is discharged from hospital.  The above  assessment, treatment plan, treatment alternatives and goals were discussed and mutually agreed upon: by patient  Thayne, Cindric 09/23/2016, 12:29 PM

## 2016-09-23 NOTE — Progress Notes (Signed)
Donald Romero is a 61 y.o. male Jul 26, 1955 [id number]  Subjective: C/o severe gout pain in L hand and L knee  Objective: Vital signs in last 24 hours: Temp:  [98.1 F (36.7 C)] 98.1 F (36.7 C) (03/16 1756) Pulse Rate:  [110-113] 113 (03/16 1756) Resp:  [19-20] 19 (03/16 1756) BP: (117-136)/(74-76) 136/76 (03/16 1756) SpO2:  [93 %-99 %] 93 % (03/16 1756) Weight change:  Last BM Date: 09/22/16  Intake/Output from previous day: 03/16 0701 - 03/17 0700 In: 240 [P.O.:240] Out: -  Last cbgs: CBG (last 3)  No results for input(s): GLUCAP in the last 72 hours.   Physical Exam General: No apparent distress   HEENT: not dry Lungs: Normal effort. Lungs clear to auscultation, no crackles or wheezes. Cardiovascular: Regular rate and rhythm, no edema Abdomen: S/NT/ND; BS(+) Musculoskeletal:  unchanged Neurological: No new neurological deficits Wounds: N/A    Skin: clear  Aging changes Mental state: Alert, oriented, cooperative    Lab Results: BMET    Component Value Date/Time   NA 137 09/23/2016 0500   K 4.3 09/23/2016 0500   CL 102 09/23/2016 0500   CO2 25 09/23/2016 0500   GLUCOSE 126 (H) 09/23/2016 0500   BUN 20 09/23/2016 0500   CREATININE 1.68 (H) 09/23/2016 0500   CALCIUM 8.8 (L) 09/23/2016 0500   GFRNONAA 43 (L) 09/23/2016 0500   GFRAA 49 (L) 09/23/2016 0500   CBC    Component Value Date/Time   WBC 9.2 09/23/2016 0500   RBC 3.03 (L) 09/23/2016 0500   HGB 8.9 (L) 09/23/2016 0500   HCT 26.9 (L) 09/23/2016 0500   PLT 186 09/23/2016 0500   MCV 88.8 09/23/2016 0500   MCH 29.4 09/23/2016 0500   MCHC 33.1 09/23/2016 0500   RDW 14.3 09/23/2016 0500   LYMPHSABS 1.6 09/23/2016 0500   MONOABS 0.8 09/23/2016 0500   EOSABS 0.3 09/23/2016 0500   BASOSABS 0.0 09/23/2016 0500    Studies/Results: No results found.  Medications: I have reviewed the patient's current medications.  Assessment/Plan:  1. S/p L2-S1 decompression and T10-pelvis fusion -- CIR 2. DVT  proph - Lovenox 3. LBP - Norco prn 4. CRF -- CrCl=38 -- will monitor 5. Severe gout flare - L hand and L knee: Prednisone x2 d and colchicine 6. Fever - on Cefazolin 7. Anemia - monitoring CBC   Length of stay, days: 1  Sonda PrimesAlex Vinod Mikesell , MD 09/23/2016, 11:42 AM

## 2016-09-23 NOTE — Progress Notes (Signed)
JP drain still in place. Nursing unable to remove. Discussed with MD on call.

## 2016-09-23 NOTE — Progress Notes (Signed)
Physical Therapy Session Note  Patient Details  Name: Quincy CarnesJames R Dolman MRN: [id number] Date of Birth: 03-22-1956  Today's Date: 09/23/2016 PT Individual Time: 1300-1345 PT Individual Time Calculation (min): 45 min   Short Term Goals: Week 1:  PT Short Term Goal 1 (Week 1): Pt will increase bed mobility with side rails to S.  PT Short Term Goal 2 (Week 1): Pt will transfers supine to edge of bed, edge of bed to supine with min A.  PT Short Term Goal 3 (Week 1): Pt will increase transfers with rolling walker to min guard.  PT Short Term Goal 4 (Week 1): Pt will increase ambulation with rolling walker to min guard about 100 feet.  PT Short Term Goal 5 (Week 1): Pt will ascend.descend 4 stairs with B rails and min A.   Skilled Therapeutic Interventions/Progress Updates:  Pt was seen bedside in the pm. Pt transported to rehab gym. Pt performed multiple sit to stand transfers with rolling walker and min to mod A with verbal cues. Pt ambulated 50 feet x 2 and 100 feet with rolling walker and min guard to min A. Pt performed step ups 10 reps with B LEs followed by 10 reps x 2 with R LE secondary to pain in L knee. Pt returned to room following treatment and left sitting up with call bell within reach.    Therapy Documentation Precautions:  Precautions Precautions: Fall, Back Required Braces or Orthoses: Spinal Brace Spinal Brace: Lumbar corset, Applied in sitting position Restrictions Weight Bearing Restrictions: No Other Position/Activity Restrictions: gout L hand and L knee  General: Chart Reviewed: Yes Family/Caregiver Present: Yes Vital Signs:  Pain: Pt c/o mod L knee pain with activity.   Other Treatments:     See Function Navigator for Current Functional Status.   Therapy/Group: Individual Therapy  Rayford HalstedMitchell, Tallis G 09/23/2016, 1:44 PM

## 2016-09-24 ENCOUNTER — Inpatient Hospital Stay (HOSPITAL_COMMUNITY): Payer: 59 | Admitting: Occupational Therapy

## 2016-09-24 ENCOUNTER — Inpatient Hospital Stay (HOSPITAL_COMMUNITY): Payer: 59

## 2016-09-24 DIAGNOSIS — M4120 Other idiopathic scoliosis, site unspecified: Secondary | ICD-10-CM

## 2016-09-24 DIAGNOSIS — M10042 Idiopathic gout, left hand: Secondary | ICD-10-CM

## 2016-09-24 DIAGNOSIS — Z9889 Other specified postprocedural states: Secondary | ICD-10-CM

## 2016-09-24 NOTE — Progress Notes (Signed)
Occupational Therapy Session Note  Patient Details  Name: Donald Romero MRN: [id number] Date of Birth: September 28, 1955  Today's Date: 09/24/2016 OT Individual Time: [id number] OT Individual Time Calculation (min): 46 min    Short Term Goals: Week 1:  OT Short Term Goal 1 (Week 1): Pt will complete LB dressing with AE and Min A OT Short Term Goal 2 (Week 1): Pt will complete 1/3 steps of toileting while adhering to back precautions OT Short Term Goal 3 (Week 1): Pt will complete bathing with Min A with use of AE OT Short Term Goal 4 (Week 1): Pt will complete sit<stand with Min A during BADLs  Skilled Therapeutic Interventions/Progress Updates: Skilled OT session completed with focus on dynamic standing balance, functional ambulation, transfers, and L UE coordination/strengthening. Pt was sitting in w/c at time of arrival, ready to go. He ambulated from room to therapy apartment with RW, Min A, and w/c follow by girlfriend [name]. Once in kitchen, had pt engage in stocking pantry items without bilateral UE support on walker. Focus on L UE use, but alternating UEs when fatrigued. Had pt engage in dishwashing of heavy mugs/large spoons and spatulas for L coordination. Simulated tub bench transfer completed afterwards with Min A and extra time for pt to elevate LEs without breaking back precautions. Discussed having tub bench for home [facility] and pt verbalizing understanding. Pt ambulated back to room. He was provided with 2 HEPs for L UE strengthening/coordination in room. He was left with all needs within reach and girlfriend at time of departure.      Therapy Documentation Precautions:  Precautions Precautions: Fall, Back, Other (comment) Precaution Comments:  (gout/pain in L UE) Required Braces or Orthoses: Spinal Brace Spinal Brace: Lumbar corset, Applied in sitting position Restrictions Weight Bearing Restrictions: No Other Position/Activity Restrictions: gout L hand and L knee  General:    Vital Signs: Therapy Vitals Temp: 98.3 F (36.8 C) Temp Source: Oral Pulse Rate: (!) 105 Resp: 18 BP: (!) 168/92 Patient Position (if appropriate): Sitting Oxygen Therapy SpO2: 95 % O2 Device: Not Delivered Pain: Pt reported pain to be manageable during session with rest breaks Pain Assessment Pain Assessment: 0-10 Pain Score: 4  Pain Type: Acute pain Pain Location: Leg Pain Orientation: Left Pain Descriptors / Indicators: Aching Pain Frequency: Intermittent Pain Onset: With Activity Pain Intervention(s): Medication (See eMAR) ADL: ADL ADL Comments: Please see functional navigator for ADL status    See Function Navigator for Current Functional Status.   Therapy/Group: Individual Therapy  Britta Louth A Alexine Pilant 09/24/2016, 3:58 PM

## 2016-09-24 NOTE — Progress Notes (Signed)
*  Preliminary Results* Bilateral lower extremity venous duplex completed. Bilateral lower extremities are negative for deep vein thrombosis. There is no evidence of Baker's cyst bilaterally.  09/24/2016 10:26 AM Gertie FeyMichelle Griffyn Kucinski, BS, RVT, RDCS, RDMS

## 2016-09-24 NOTE — Progress Notes (Signed)
Donald Romero is a 61 y.o. male 06/19/1956 [id number]  Subjective: No new complaints. Gout pain is better. Slept well. Feeling OK.  Objective: Vital signs in last 24 hours: Temp:  [97.6 F (36.4 C)-98 F (36.7 C)] 98 F (36.7 C) (03/18 0459) Pulse Rate:  [103-114] 103 (03/18 0459) Resp:  [18-19] 18 (03/18 0459) BP: (136-142)/(73-76) 136/76 (03/18 0459) SpO2:  [99 %] 99 % (03/18 0459) Weight change:  Last BM Date: 09/22/16  Intake/Output from previous day: 03/17 0701 - 03/18 0700 In: 720 [P.O.:720] Out: 1050 [Urine:1000; Drains:50] Last cbgs: CBG (last 3)  No results for input(s): GLUCAP in the last 72 hours.   Physical Exam General: No apparent distress   HEENT: not dry Lungs: Normal effort. Lungs clear to auscultation, no crackles or wheezes. Cardiovascular: Regular rate and rhythm, no edema Abdomen: S/NT/ND; BS(+) Musculoskeletal:  L hand is less swollen and less tender Neurological: No new neurological deficits Wounds: clean    Skin: clear  Aging changes Mental state: Alert, oriented, cooperative    Lab Results: BMET    Component Value Date/Time   NA 137 09/23/2016 0500   K 4.3 09/23/2016 0500   CL 102 09/23/2016 0500   CO2 25 09/23/2016 0500   GLUCOSE 126 (H) 09/23/2016 0500   BUN 20 09/23/2016 0500   CREATININE 1.68 (H) 09/23/2016 0500   CALCIUM 8.8 (L) 09/23/2016 0500   GFRNONAA 43 (L) 09/23/2016 0500   GFRAA 49 (L) 09/23/2016 0500   CBC    Component Value Date/Time   WBC 9.2 09/23/2016 0500   RBC 3.03 (L) 09/23/2016 0500   HGB 8.9 (L) 09/23/2016 0500   HCT 26.9 (L) 09/23/2016 0500   PLT 186 09/23/2016 0500   MCV 88.8 09/23/2016 0500   MCH 29.4 09/23/2016 0500   MCHC 33.1 09/23/2016 0500   RDW 14.3 09/23/2016 0500   LYMPHSABS 1.6 09/23/2016 0500   MONOABS 0.8 09/23/2016 0500   EOSABS 0.3 09/23/2016 0500   BASOSABS 0.0 09/23/2016 0500    Studies/Results: No results found.  Medications: I have reviewed the patient's current  medications.  Assessment/Plan:  1. He is s/p L2-S1 decompression and T10-pelvis fusion -- cont/w CIR 2. DVT proph: Lovenox sq 3. LBP -  Cont w/ Norco prn, PT 4. CRF -- CrCl=38 -- will monitor 5. Severe gout flare (better) - L hand and L knee: Prednisone x2 d and stop and colchicine to continue 6. Fever - on Cefazolin - completed. D/c IV 7. Anemia - watching CBC      Length of stay, days: 2  Sonda PrimesAlex Christyn Gutkowski , MD 09/24/2016, 9:17 AM

## 2016-09-25 ENCOUNTER — Inpatient Hospital Stay (HOSPITAL_COMMUNITY): Payer: 59 | Admitting: Occupational Therapy

## 2016-09-25 ENCOUNTER — Inpatient Hospital Stay (HOSPITAL_COMMUNITY): Payer: 59

## 2016-09-25 ENCOUNTER — Encounter (HOSPITAL_COMMUNITY): Payer: Self-pay | Admitting: Neurological Surgery

## 2016-09-25 ENCOUNTER — Inpatient Hospital Stay (HOSPITAL_COMMUNITY): Payer: 59 | Admitting: Physical Therapy

## 2016-09-25 DIAGNOSIS — D62 Acute posthemorrhagic anemia: Secondary | ICD-10-CM

## 2016-09-25 DIAGNOSIS — I10 Essential (primary) hypertension: Secondary | ICD-10-CM

## 2016-09-25 DIAGNOSIS — N183 Chronic kidney disease, stage 3 unspecified: Secondary | ICD-10-CM

## 2016-09-25 DIAGNOSIS — E46 Unspecified protein-calorie malnutrition: Secondary | ICD-10-CM

## 2016-09-25 DIAGNOSIS — M10042 Idiopathic gout, left hand: Secondary | ICD-10-CM

## 2016-09-25 DIAGNOSIS — R000 Tachycardia, unspecified: Secondary | ICD-10-CM

## 2016-09-25 DIAGNOSIS — E8809 Other disorders of plasma-protein metabolism, not elsewhere classified: Secondary | ICD-10-CM

## 2016-09-25 MED ORDER — HYDRALAZINE HCL 25 MG PO TABS
25.0000 mg | ORAL_TABLET | Freq: Three times a day (TID) | ORAL | Status: DC
  Administered 2016-09-25 – 2016-09-26 (×4): 25 mg via ORAL
  Filled 2016-09-25 (×4): qty 1

## 2016-09-25 MED ORDER — PRO-STAT SUGAR FREE PO LIQD
30.0000 mL | Freq: Two times a day (BID) | ORAL | Status: DC
  Administered 2016-09-25 – 2016-09-27 (×5): 30 mL via ORAL
  Filled 2016-09-25 (×5): qty 30

## 2016-09-25 MED ORDER — CEPHALEXIN 250 MG PO CAPS
500.0000 mg | ORAL_CAPSULE | Freq: Three times a day (TID) | ORAL | Status: DC
  Administered 2016-09-25 – 2016-09-27 (×6): 500 mg via ORAL
  Filled 2016-09-25 (×6): qty 2

## 2016-09-25 NOTE — Progress Notes (Signed)
Physical Therapy Session Note  Patient Details  Name: Donald CarnesJames R Beezley MRN: [id number] Date of Birth: [birth year]  Today's Date: 09/25/2016 PT Individual Time: [id number] PT Individual Time Calculation (min): 30 min   Short Term Goals: Week 1:  PT Short Term Goal 1 (Week 1): Pt will increase bed mobility with side rails to S.  PT Short Term Goal 2 (Week 1): Pt will transfers supine to edge of bed, edge of bed to supine with min A.  PT Short Term Goal 3 (Week 1): Pt will increase transfers with rolling walker to min guard.  PT Short Term Goal 4 (Week 1): Pt will increase ambulation with rolling walker to min guard about 100 feet.  PT Short Term Goal 5 (Week 1): Pt will ascend.descend 4 stairs with B rails and min A.   Skilled Therapeutic Interventions/Progress Updates:    Pt in bed upon arrival. Working on logroll supine to sitting, needing multimodal cues for technique. Brace donned with mod assistance at EOB. Sit<>stand performed with rw from bed>w/c. Pt propelling w/c 100 ft with UEs, supervision assistance. Ambulation performed 1 X20 ft, 1 X 90 ft using rw and min guard assistance. Bilateral ice packs strapped to knees per pt request. Cues again needed for logroll technique when returning from sitting to supine. Pt in bed with all needs within reach.   Therapy Documentation Precautions:  Precautions Precautions: Fall, Back, Other (comment) Precaution Comments:  (gout/pain in L UE) Required Braces or Orthoses: Spinal Brace Spinal Brace: Lumbar corset, Applied in sitting position Restrictions Weight Bearing Restrictions: No Other Position/Activity Restrictions: gout L hand and L knee  Pain: Rating pain as 8/10 (knees), nurse providing pain medication during session.   See Function Navigator for Current Functional Status.   Therapy/Group: Individual Therapy  Christiane HaBenjamin J. Bryanda Mikel, PT, CSCS Pager [phone] Office [phone]  09/25/2016, 4:37 PM

## 2016-09-25 NOTE — Care Management Note (Signed)
[facility] Individual Statement of Services  Patient Name:  Donald Romero  Date:  09/25/2016  Welcome to the [facility].  Our goal is to provide you with an individualized program based on your diagnosis and situation, designed to meet your specific needs.  With this comprehensive rehabilitation program, you will be expected to participate in at least 3 hours of rehabilitation therapies Monday-Friday, with modified therapy programming on the weekends.  Your rehabilitation program will include the following services:  Physical Therapy (PT), Occupational Therapy (OT), 24 hour per day rehabilitation nursing, Case Management (Social Worker), Rehabilitation Medicine, Nutrition Services and Pharmacy Services  Weekly team conferences will be held on Wednesday to discuss your progress.  Your Social Worker will talk with you frequently to get your input and to update you on team discussions.  Team conferences with you and your family in attendance may also be held.  Expected length of stay: 12-14 days  Overall anticipated outcome: mod/I level  Depending on your progress and recovery, your program may change. Your Social Worker will coordinate services and will keep you informed of any changes. Your Social Worker's name and contact numbers are listed  below.  The following services may also be recommended but are not provided by the [facility]:   Driving Evaluations  Home Health Rehabiltiation Services  Outpatient Rehabilitation Services  Vocational Rehabilitation   Arrangements will be made to provide these services after discharge if needed.  Arrangements include referral to agencies that provide these services.  Your insurance has been verified to be:  [name] Your primary doctor is:  Octavio [place] MD  Pertinent information will be shared with your doctor and your insurance company.  Social Worker:  Dossie DerBecky Jasminne Mealy, SW [phone] or ([phone])  [phone]  Information discussed with and copy given to patient by: Lucy Chrisupree, Lauralynn Loeb G, 09/25/2016, 1:06 PM

## 2016-09-25 NOTE — Progress Notes (Signed)
Physical Therapy Note  Patient Details  Name: Donald Romero MRN: [id number] Date of Birth: 1956/03/11 Today's Date: 09/25/2016  0945- 1030, 45 min individual tx Pain: 8/10 bil knees, low back; premedicated Pt able to state and describe 4 back precautions and observe them during mobility.  w/c propulsion x 100' using bil UEs, min assist for steering.  Seated neuromuscular re-education via demo, multimodal cues for 20 x 1 toe lifts, heel lifts, hip flexion R/L, bil hip adduction for core activation, glut sets. Gait with RW, mod assist to stand, min assist x 12'.  Distance limited by bil knee pain.  PT consulted with Skeet SimmerHilary, RN who stated pt is developing gout R knee as well. PT provided ice packs bil knees, and supplied R footrest to provide more level pelvic positon in w/c.  PT educated pt on importance of intake and avoiding constipation to decrease pain of low back.  Pt left resting in w/c all needs within reach.  [place] for next tx.   See function navigator for current status.   Floyce Bujak 09/25/2016, 7:53 AM

## 2016-09-25 NOTE — Progress Notes (Signed)
Occupational Therapy Note  Patient Details  Name: Cordaryl R Udell MRN: Quincy [id number] Date of Birth: 1956/01/29  Today's Date: 09/25/2016 OT Individual Time: 1030-1100 OT Individual Time Calculation (min): 30 min   Pt c/o 10/10 pain in B knees; RN aware and ice packs applied Individual Therapy  Pt resting in w/c with ice packs on B knees 2/2 increased pain from gout.  Pt unable to engaged in sit<>stand or standing balance tasks but agreeable to BUE therex seated in w/c.  Pt engaged in BUE therex with 5# weight bar, 3kg ball, and 2# dumb bells.  Focus on continued activity tolerance and BUE strengthening in increase independence with BADLs   Rich BraveLanier, Rodarius Kichline Chappell 09/25/2016, 11:56 AM

## 2016-09-25 NOTE — Progress Notes (Signed)
[place] PHYSICAL MEDICINE & REHABILITATION     PROGRESS NOTE  Subjective/Complaints:  Pt seen sitting up in his chair this AM.  He slept fair overnight, but had knee pain.  He complains of pain, most significant in his knees.   ROS: +L>R knee pain. Denies CP, SOB, N/V/D.  Objective: Vital Signs: Blood pressure (!) 160/79, pulse 99, temperature 98 F (36.7 C), temperature source Oral, resp. rate 16, height 5\' 2"  (1.575 m), SpO2 97 %. No results found.  Recent Labs  09/23/16 0500  WBC 9.2  HGB 8.9*  HCT 26.9*  PLT 186    Recent Labs  09/23/16 0500  NA 137  K 4.3  CL 102  GLUCOSE 126*  BUN 20  CREATININE 1.68*  CALCIUM 8.8*   CBG (last 3)  No results for input(s): GLUCAP in the last 72 hours.  Wt Readings from Last 3 Encounters:  09/19/16 74 kg (163 lb 2.3 oz)  09/11/16 75.2 kg (165 lb 11.2 oz)  07/28/16 77.3 kg (170 lb 8 oz)    Physical Exam:  BP (!) 160/79 (BP Location: Right Arm)   Pulse 99   Temp 98 F (36.7 C) (Oral)   Resp 16   Ht 5\' 2"  (1.575 m)   SpO2 97%  Constitutional: He appears well-developed and well-nourished. No distress.  HENT: Normocephalic and atraumatic.  Eyes: EOMI. No discharge. Cardiovascular: Normal rate and regular rhythm.  No JVD. Respiratory: Effort normal. No respiratory distress.  GI: BS+. He exhibits no distension.  Musculoskeletal: +Edema and tenderness > in left knee.  Neurological: He is alert and oriented.  Motor:  B/l UE 5/5 prox to distal.  B/l LE: 3-/5 HF, 4-/5 KE and 4+/5 ADF/PF.  Skin: No rash noted. No erythema.  Surgical incision dressed, drain intact.   Psychiatric: He has a normal mood and affect. His behavior is normal. Judgment and thought content normal.    Assessment/Plan: 1. Functional deficits secondary to lumbar scoliosis and radiculopathy s/p L2-S1 decompression with T10 pelvis fusion which require 3+ hours per day of interdisciplinary therapy in a comprehensive inpatient rehab setting. Physiatrist  is providing close team supervision and 24 hour management of active medical problems listed below. Physiatrist and rehab team continue to assess barriers to discharge/monitor patient progress toward functional and medical goals.  Function:  Bathing Bathing position   Position: Wheelchair/chair at sink  Bathing parts Body parts bathed by patient: Right arm, Left arm, Chest, Abdomen, Front perineal area, Buttocks, Right upper leg, Left upper leg Body parts bathed by helper: Right lower leg, Left lower leg, Back  Bathing assist Assist Level:  (Mod A)      Upper Body Dressing/Undressing Upper body dressing   What is the patient wearing?: Pull over shirt/dress, Orthosis     Pull over shirt/dress - Perfomed by patient: Thread/unthread right sleeve, Thread/unthread left sleeve, Put head through opening, Pull shirt over trunk       Orthosis activity level: Performed by helper  Upper body assist Assist Level: Supervision or verbal cues      Lower Body Dressing/Undressing Lower body dressing   What is the patient wearing?: Pants, Non-skid slipper socks     Pants- Performed by patient: Pull pants up/down Pants- Performed by helper: Thread/unthread right pants leg, Thread/unthread left pants leg   Non-skid slipper socks- Performed by helper: Don/doff right sock, Don/doff left sock                  Lower body assist  Assist for lower body dressing:  (Max A)      Toileting Toileting Toileting activity did not occur: No continent bowel/bladder event Toileting steps completed by patient: Adjust clothing prior to toileting, Performs perineal hygiene, Adjust clothing after toileting   Toileting Assistive Devices: Grab bar or rail  Toileting assist     Transfers Chair/bed transfer   Chair/bed transfer method: Stand pivot Chair/bed transfer assist level: Moderate assist (Pt 50 - 74%/lift or lower) Chair/bed transfer assistive device: Bedrails, Armrests      Locomotion Ambulation     Max distance: 100 Assist level: Touching or steadying assistance (Pt > 75%)   Wheelchair   Type: Manual Max wheelchair distance: 50 Assist Level: Supervision or verbal cues  Cognition Comprehension Comprehension assist level: Understands complex 90% of the time/cues 10% of the time  Expression Expression assist level: Expresses complex 90% of the time/cues < 10% of the time  Social Interaction Social Interaction assist level: Interacts appropriately with others - No medications needed.  Problem Solving Problem solving assist level: Solves complex 90% of the time/cues < 10% of the time  Memory Memory assist level: Complete Independence: No helper    Medical Problem List and Plan: 1.  Functional and mobility deficits secondary to lumbar scoliosis and radiculopathy s/p L2-S1 decompression with T10 pelvis fusion  Begin CIR  Notes reviewed, CT L-spine reviewed (with scoliosis, spondylosis and disc degeneration) 2.  DVT Prophylaxis/Anticoagulation: Pharmaceutical: SCDs.   Lovenox to started ~3/24 per NS.  Dopplers neg for DVT 3. Pain Management:  Managed by hydrocodone prn              pain appears well controlled at present in respect to back.  4. Mood: LCSW to follow for evaluation and support.  5. Neuropsych: This patient is capable of making decisions on his own behalf. 6. Skin/Wound Care: Monitor incision for healing. Routine pressure relief measures. Maintain adequate nutritional and hydration status.              Will d/c drain per NS 7. Fluids/Electrolytes/Nutrition: Encourage fluid intake.  8. Gout flare:   Steroids ordered x2 days, cont colchicine 9. Low grade fevers?FUO:  Resolved 10. ABLA: Improved post transfusion. Monitor serially.   Hb 8.9 on 3/17  Cont to monitor 11. Tachycardia: Multifactorial due to pain, anemia and deconditioning.   Improving 12. Acute on chronic renal failure: Resolving. Avoid nephrotoxic medication and hypotension.   Encourage fluid intake.  Cr 1.68 on 3/17 (around baseline)  Cont to monitor 13. HTN  Hydralazine 25 TID started 3/19 (Home 100) 14. Hypoalbuminemia  Supplement initiated 3/19  LOS (Days) 3 A FACE TO FACE EVALUATION WAS PERFORMED  Ankit Karis Juba 09/25/2016 9:09 AM

## 2016-09-25 NOTE — Evaluation (Signed)
Recreational Therapy Assessment and Plan  Patient Details  Name: Donald Romero MRN: [id number] Date of Birth: 12-Oct-1955 Today's Date: 09/25/2016  Rehab Potential: Excellent ELOS: 10 days   Assessment Problem List:      Patient Active Problem List   Diagnosis Date Noted  . Scoliosis of lumbar spine 09/22/2016  . Acute respiratory failure with hypoxia ([place])   . Scoliosis 09/18/2016  . Postoperative respiratory failure ([place]) 09/18/2016  . Sinus tachycardia 09/18/2016  . Acute post-operative pain 09/18/2016  . Goals of care, counseling/discussion 09/18/2016    Past Medical History:      Past Medical History:  Diagnosis Date  . Arthritis   . Gout   . Hypertension    Past Surgical History:       Past Surgical History:  Procedure Laterality Date  . BACK SURGERY    . COLONOSCOPY    . TONSILLECTOMY    . WRIST SURGERY Right     Assessment & Plan Clinical Impression: Donald Romero a 61 y.o.malewith history of HTN, CKD stage III, idiopathic scoliosis treated with T2- L2 fusion at age 73 and now with progressive worsening of back and RLE>LLE pain. He was found to have scoliosis and lumbosacral spondylosis with radiculopathy and elected to undergo T 10- pelvis fixation and fusion with L2 to S1 decompression by Dr. Cyndy Freeze. Patient with prolonged surgery with acute on chronic renal failure, hypotension, ABLA and need for prolonged intubation. PCCM consulted for assistance and patient tolerated extubation on 3/13. He has had flare up of gout in left hand and was started on cholchine.    Pt presents with decreased activity tolerance, decreased functional mobility, decreased balance, decreased coordination Limiting pt's independence with leisure/community pursuits.    Leisure History/Participation Premorbid leisure interest/current participation: Joretta Bachelor care;Petra Kuba - Fishing;Community - Shopping mall;Community - Building control surveyor - Travel  (Comment);Games - Board games;Games - Cards Other Leisure Interests: Television;Movies;Reading Leisure Participation Style: With Family/Friends Awareness of Community Resources: Excellent Psychosocial / Spiritual Stress Management: Good Patient agreeable to Pet Therapy: Yes Does patient have pets?: No Social interaction - Mood/Behavior: Cooperative Academic librarian Appropriate for Education?: Yes Recreational Therapy Orientation Orientation -Reviewed with patient: Available activity resources Strengths/Weaknesses Patient Strengths/Abilities: Willingness to participate;Active premorbidly Patient weaknesses: Physical limitations TR Patient demonstrates impairments in the following area(s): Edema;Endurance;Motor;Pain  Plan Rec Therapy Plan Is patient appropriate for Therapeutic Recreation?: Yes Rehab Potential: Excellent Treatment times per week: MIn 1 TR group/session during LOS >30 minutes Estimated Length of Stay: 10 days TR Treatment/Interventions: Adaptive equipment instruction;Balance/vestibular training;Functional mobility training;1:1 session;Community reintegration;Group participation (Comment);Patient/family education;Therapeutic activities;Recreation/leisure participation;Therapeutic exercise;UE/LE Coordination activities  Recommendations for other services: None   Discharge Criteria: Patient will be discharged from TR if patient refuses treatment 3 consecutive times without medical reason.  If treatment goals not met, if there is a change in medical status, if patient makes no progress towards goals or if patient is discharged from hospital.  The above assessment, treatment plan, treatment alternatives and goals were discussed and mutually agreed upon: by patient  [place] 09/25/2016, 11:21 AM

## 2016-09-25 NOTE — Progress Notes (Signed)
Occupational Therapy Session Note  Patient Details  Name: Quincy CarnesJames R Hodgkins MRN: [id number] Date of Birth: 04-11-1956  Today's Date: 09/25/2016 OT Individual Time: 1130-1200 OT Individual Time Calculation (min): 30 min    Short Term Goals: Week 1:  OT Short Term Goal 1 (Week 1): Pt will complete LB dressing with AE and Min A OT Short Term Goal 2 (Week 1): Pt will complete 1/3 steps of toileting while adhering to back precautions OT Short Term Goal 3 (Week 1): Pt will complete bathing with Min A with use of AE OT Short Term Goal 4 (Week 1): Pt will complete sit<stand with Min A during BADLs  Skilled Therapeutic Interventions/Progress Updates:    Upon entering the room, pt supine in bed with wife present in the room. Pt with c/o fatigue this session but agreeable to activities from bed side. Pt performed supine >sit with supervision and min cues for log roll technique. Pt already had lumbar corset donned prior to OT arrival. Pt engaged in seated L UE fine motor coordination card game. Pt required multiple attempts and increased time for card tasks. Pt also returning demonstrations for isolated finger movements which pt was able to perform with increased time for each digit. Pt remained seated on EOB as lunch tray arrived. Call bell and all needed items within reach upon exiting the room.   Therapy Documentation Precautions:  Precautions Precautions: Fall, Back, Other (comment) Precaution Comments:  (gout/pain in L UE) Required Braces or Orthoses: Spinal Brace Spinal Brace: Lumbar corset, Applied in sitting position Restrictions Weight Bearing Restrictions: No Other Position/Activity Restrictions: gout L hand and L knee  General: General PT Missed Treatment Reason: Patient ill (Comment) (gout pain bil knees) Vital Signs: Therapy Vitals BP: (!) 160/92 Pain: Pain Assessment Pain Assessment: 0-10 Pain Score: 7  Pain Type: Acute pain Pain Location: Knee Pain Orientation: Right;Left Pain  Descriptors / Indicators: Aching Pain Frequency: Constant Pain Onset: On-going Pain Intervention(s): Medication (See eMAR) ADL: ADL ADL Comments: Please see functional navigator for ADL status Exercises:   Other Treatments:    See Function Navigator for Current Functional Status.   Therapy/Group: Individual Therapy  Alen BleacherBradsher, Ura Yingling P 09/25/2016, 12:28 PM

## 2016-09-25 NOTE — Progress Notes (Signed)
Social Work Assessment and Plan Social Work Assessment and Plan  Patient Details  Name: Donald Romero MRN: [id number] Date of Birth: [birth year]  Today's Date: 09/25/2016  Problem List:  Patient Active Problem List   Diagnosis Date Noted  . Acute blood loss anemia   . Tachycardia   . Stage 3 chronic kidQuincy Carnesney disease   . Benign essential HTN   . Hypoalbuminemia due to protein-calorie malnutrition (HCC)   . Acute idiopathic gout of left hand   . Scoliosis of lumbar spine 09/22/2016  . Acute respiratory failure with hypoxia (HCC)   . Scoliosis 09/18/2016  . Postoperative respiratory failure (HCC) 09/18/2016  . Sinus tachycardia 09/18/2016  . Acute post-operative pain 09/18/2016  . Goals of care, counseling/discussion 09/18/2016   Past Medical History:  Past Medical History:  Diagnosis Date  . Arthritis   . Gout   . Hypertension    Past Surgical History:  Past Surgical History:  Procedure Laterality Date  . APPLICATION OF ROBOTIC ASSISTANCE FOR SPINAL PROCEDURE N/A 09/18/2016   Procedure: APPLICATION OF ROBOTIC ASSISTANCE FOR SPINAL PROCEDURE;  Surgeon: Loura HaltBenjamin Jared Ditty, MD;  [facility];  Service: Neurosurgery;  Laterality: N/A;  . BACK SURGERY    . COLONOSCOPY    . POSTERIOR LUMBAR FUSION 4 LEVEL N/A 09/18/2016   Procedure: Lumbar two-Sacrum one  Laminectomy for decompression with Thoracic ten  to pelvis fixation/fusion with Mazor robot;  Surgeon: Loura HaltBenjamin Jared Ditty, MD;  [facility];  Service: Neurosurgery;  Laterality: N/A;  . TONSILLECTOMY    . WRIST SURGERY Right    Social History:  reports that he has been smoking.  He has been smoking about 0.50 packs per day. He has never used smokeless tobacco. He reports that he does not drink alcohol or use drugs.  Family / Support Systems Marital Status: Divorced Patient Roles: Other (Comment) (Sister and friend) Other Supports: Donald Romero-sister [phone]  Donald SessionVonda- [phone]-cell-friend-roommate Anticipated  Caregiver: Donald SessionVonda Ability/Limitations of Caregiver: None Caregiver Availability: 24/7 Family Dynamics: Close with his sister and Donald SessionVonda she is actually here today to observe pt in his therapies and provide support to him. He has always been independent and wants to remain this way. He feels with time he can reach the goals he has set for himself  Social History Preferred language: English Religion: None Cultural Background: No issues Education: High School Read: Yes Write: Yes Employment Status: Employed Name of Employer: Surveyor, quantityWater Treatment Operator Return to Work Plans: Plans to return when recovered from his back surgery. Currently he is on a leave from his job Fish farm managerLegal Hisotry/Current Legal Issues: No issues Guardian/Conservator: None-according to MD pt is capable of making his own decisions while here   Abuse/Neglect Physical Abuse: Denies Verbal Abuse: Denies Sexual Abuse: Denies Exploitation of patient/patient's resources: Denies Self-Neglect: Denies  Emotional Status Pt's affect, behavior adn adjustment status: Pt is motivated and ready to participate in therapies. He feels better and is not having as much pain as he was right after surgery. He hopes to be here a couple weeks then go back home with some follow up therapies. Recent Psychosocial Issues: other health issues-scoliosis and gout flare up while here Pyschiatric History: No history deferred depression screen due to doing well and coping appropriately with his surgery and pain issues. Will monitor and see if team feels he needs to be seen by neuro-psych while here Substance Abuse History: Tobacco aware of the risks of smoking and unsure if he plans to quit or not.  Patient /  Family Perceptions, Expectations & Goals Pt/Family understanding of illness & functional limitations: Pt and friend can explain his back surgery and limitations. They have spoken with the MD and feel their questions and concerns are being addressed. Pt  hopes to be here a short time then return home. Premorbid pt/family roles/activities: Employee, brother, friend, etc Anticipated changes in roles/activities/participation: resume Pt/family expectations/goals: Pt states: " I want to be able to take care of myself before I leave here."  Donald Romero states: " I hope he can be walking by himself."  [employer]: None Premorbid Home Care/DME Agencies: None Transportation available at discharge: Friend and sister  Discharge Planning Living Arrangements: Non-relatives/Friends Support Systems: Other relatives, Friends/neighbors Type of Residence: Private residence Civil engineer, contracting: Media planner (specify) Administrator) Financial Resources: Employment Surveyor, quantity Screen Referred: No Living Expenses: Psychologist, sport and exercise Management: Patient Does the patient have any problems obtaining your medications?: No Home Management: Both he and Psychologist, occupational Plans: Return to apartment with [place] assisting him with home management tasks. He needs to be mod/i level before going home since she does not plan to physically assist him at home. He should reach these goals while here. Social Work Anticipated Follow Up Needs: HH/OP  Clinical Impression Pleasant gentleman who is motivated to do well and achieve his mod/I level goals. His sister and friend-Donald Romero are supportive and willing to assist with some of his care. His goals are mod/I and should reach these while here. Will work on a safe discharge plan. Donald Romero is here today to observe in therapies and provide support to him.  Donald Romero 09/25/2016, 1:04 PM

## 2016-09-25 NOTE — IPOC Note (Signed)
Overall Plan of Care [facility](IPOC) Patient Details Name: Quincy CarnesJames R Koroma MRN: [id number] DOB: 02/27/1956  Admitting Diagnosis: Debility  Hospital Problems: Active Problems:   Scoliosis of lumbar spine   Acute idiopathic gout of left hand   Acute blood loss anemia   Tachycardia   Stage 3 chronic kidney disease   Benign essential HTN   Hypoalbuminemia due to protein-calorie malnutrition [facility](HCC)     Functional Problem List: Nursing Bowel, Bladder, Endurance, Pain, Safety, Skin Integrity  PT Balance, Motor, Endurance, Pain  OT Balance, Safety, Edema, Endurance, Motor  SLP    TR         Basic ADL's: OT Grooming, Bathing, Dressing, Toileting     Advanced  ADL's: OT Simple Meal Preparation, Full Meal Preparation, Laundry     Transfers: PT Bed Mobility, Bed to Chair, Customer service managerCar  OT Toilet, Tub/Shower     Locomotion: PT Ambulation, Stairs     Additional Impairments: OT Fuctional Use of Upper Extremity  SLP        TR      Anticipated Outcomes Item Anticipated Outcome  Self Feeding N/A  Swallowing      Basic self-care  Mod I  Toileting  Mod I    Bathroom Transfers Supervision-Mod I   Bowel/Bladder  Continent to bowel and bladder with min. assist.  Transfers  mod I transfers  Locomotion  mod I gait, S stairs  Communication     Cognition     Pain  Less than 3,on 1 to 10 scale.  Safety/Judgment  Free from falls during his stay in rehab.   Therapy Plan: PT Intensity: Minimum of 1-2 x/day ,45 to 90 minutes PT Frequency: 5 out of 7 days PT Duration Estimated Length of Stay: 12 to 14 days OT Intensity: Minimum of 1-2 x/day, 45 to 90 minutes OT Frequency: 5 out of 7 days OT Duration/Estimated Length of Stay: 14-16 days         Team Interventions: Nursing Interventions Patient/Family Education, Pain Management, Disease Management/Prevention  PT interventions Ambulation/gait training, Warden/rangerBalance/vestibular training, DME/adaptive equipment instruction, Discharge planning,  Functional mobility training, Neuromuscular re-education, Therapeutic Activities, UE/LE Strength taining/ROM, UE/LE Coordination activities, Therapeutic Exercise, Stair training, Splinting/orthotics, Patient/family education, Pain management  OT Interventions Discharge planning, Pain management, Self Care/advanced ADL retraining, Therapeutic Activities, UE/LE Coordination activities, Functional mobility training, Patient/family education, Therapeutic Exercise, DME/adaptive equipment instruction, Neuromuscular re-education, Psychosocial support, UE/LE Strength taining/ROM  SLP Interventions    TR Interventions    SW/CM Interventions Discharge Planning, Psychosocial Support, Patient/Family Education    Team Discharge Planning: Destination: PT-Home ,OT- Home , SLP-  Projected Follow-up: PT-Home health PT, OT-  Home health OT, SLP-  Projected Equipment Needs: PT-To be determined, OT- 3 in 1 bedside comode, To be determined, SLP-  Equipment Details: PT- , OT-  Patient/family involved in discharge planning: PT- Patient,  OT-Patient, Family member/caregiver, SLP-   MD ELOS: 10-14 days. Medical Rehab Prognosis:  Good Assessment: 60 y.o.malewith history of HTN, CKD stage III, idiopathic scoliosis treated with T2- L2 fusion at age 61 and now with progressive worsening of back and RLE>LLE pain. He was found to have scoliosis and lumbosacral spondylosis with radiculopathy and elected to undergo T 10- pelvis fixation and fusion with L2 to S1 decompression by Dr. Bevely Palmeritty. Patient with prolonged surgery with acute on chronic renal failure, hypotension, ABLA and need for prolonged intubation. PCCM consulted for assistance and patient tolerated extubation on 3/13. He has had flare up of gout in left hand and was  started on cholcicine. He was transfused with one unit PRBC. Issues with tachycardia felt to be related to pain and anemia per TRH.   Therapy ongoing and patient noted to have decreased WB on LLE,  decreased balance with posterior lean and weakness affecting mobility and ability to carry out ADL tasks. Will set goals for Mod I for most tasks with PT/OT.  See Team Conference Notes for weekly updates to the plan of care

## 2016-09-25 NOTE — Progress Notes (Signed)
Occupational Therapy Session Note  Patient Details  Name: Donald Romero MRN: [id number] Date of Birth: Oct 16, 1955  Today's Date: 09/25/2016 OT Individual Time: 0803-0900 OT Individual Time Calculation (min): 57 min   Short Term Goals: Week 1:  OT Short Term Goal 1 (Week 1): Pt will complete LB dressing with AE and Min A OT Short Term Goal 2 (Week 1): Pt will complete 1/3 steps of toileting while adhering to back precautions OT Short Term Goal 3 (Week 1): Pt will complete bathing with Min A with use of AE OT Short Term Goal 4 (Week 1): Pt will complete sit<stand with Min A during BADLs  Skilled Therapeutic Interventions/Progress Updates:    1:1 OT treatment session focused on modified bathing/dressing tasks using long-handled AE, improved sit<>stand, standing endurance, and functional transfers. Pt already in wc upon OT arrival, completed bathing/dressing at the sink using long-handled sponge for LB washing. Sit<>stand w/ min/mod A, able to maintain standing for ~ 3 minutes with min guard A for balance 2/2 LE weakness and core instability. Reviewed ADL modifications while maintaining back precautions. Min A and verbal cues to utilize reacher to maximize independence with threading pants. Pt unable to maintain wide base stance to keep pants above knees, requiring assistance to reach and pull pants over hips. Unilaterall UE support to maintain standing balance while alternating UEs to pull up pants- min guard A for balance and verbal cues for anterior weight shift. OT educated pt and girlfriend on modified strategy for donning TED hose, then pt able to don non-skid socks with set-up using sock-aid. Pt left with needs met and girlfriend present.   Therapy Documentation Precautions:  Precautions Precautions: Fall, Back, Other (comment) Precaution Comments:  (gout/pain in L UE) Required Braces or Orthoses: Spinal Brace Spinal Brace: Lumbar corset, Applied in sitting position Restrictions Weight  Bearing Restrictions: No Other Position/Activity Restrictions: gout L hand and L knee  Pain: Pain Assessment Pain Assessment: 0-10 Pain Score: 8-Worst pain ever Faces Pain Scale: Hurts little more Pain Location: Back (lower back and legs) Pain Descriptors / Indicators: Aching Pain Intervention(s):Repositioned ADL: ADL ADL Comments: Please see functional navigator for ADL status Exercises:   Other Treatments:    See Function Navigator for Current Functional Status.   Therapy/Group: Individual Therapy  Valma Cava 09/25/2016, 8:09 AM

## 2016-09-25 NOTE — Progress Notes (Signed)
Patient information reviewed and entered into [facility] system by Mandy Fitzwater, RN, CRRN, PPS Coordinator.  Information including medical coding and functional independence measure will be reviewed and updated through discharge.    

## 2016-09-26 ENCOUNTER — Inpatient Hospital Stay (HOSPITAL_COMMUNITY): Payer: 59 | Admitting: Occupational Therapy

## 2016-09-26 ENCOUNTER — Inpatient Hospital Stay (HOSPITAL_COMMUNITY): Payer: 59 | Admitting: Physical Therapy

## 2016-09-26 DIAGNOSIS — M10062 Idiopathic gout, left knee: Secondary | ICD-10-CM

## 2016-09-26 MED ORDER — HYDRALAZINE HCL 50 MG PO TABS
50.0000 mg | ORAL_TABLET | Freq: Three times a day (TID) | ORAL | Status: DC
  Administered 2016-09-26 – 2016-09-29 (×9): 50 mg via ORAL
  Filled 2016-09-26 (×9): qty 1

## 2016-09-26 MED ORDER — PREDNISONE 20 MG PO TABS
40.0000 mg | ORAL_TABLET | Freq: Every day | ORAL | Status: DC
  Administered 2016-09-26 – 2016-09-29 (×4): 40 mg via ORAL
  Filled 2016-09-26 (×4): qty 2

## 2016-09-26 NOTE — Progress Notes (Signed)
Physical Therapy Session Note  Patient Details  Name: Donald Romero MRN: [id number] Date of Birth: Jan 02, 1956  Today's Date: 09/26/2016 PT Individual Time: 1005-1038 PT Individual Time Calculation (min): 33 min   Short Term Goals: Week 1:  PT Short Term Goal 1 (Week 1): Pt will increase bed mobility with side rails to S.  PT Short Term Goal 2 (Week 1): Pt will transfers supine to edge of bed, edge of bed to supine with min A.  PT Short Term Goal 3 (Week 1): Pt will increase transfers with rolling walker to min guard.  PT Short Term Goal 4 (Week 1): Pt will increase ambulation with rolling walker to min guard about 100 feet.  PT Short Term Goal 5 (Week 1): Pt will ascend.descend 4 stairs with B rails and min A.   Skilled Therapeutic Interventions/Progress Updates:   Patient supine with ice packs applied to B knees, c/o 10/10 gout pain with activity and declining OOB or upright activities as pain has eased off to 6-7/10 in knees with ice and rest, RN and MD aware. Instructed in BUE therex using orange theraband for elbow flexion, elbow extension, shoulder flexion, shoulder ER, across body diagonals, and shoulder abduction and BLE therex to tolerance for ankle pumps, SLR RLE (unable to perform with LLE due to pain), and glute sets. Discussed DME needs and HHPT f/u. Patient declined further therapies due to pain and left supine in bed with all needs in reach and ice packs on knees, girlfriend in room.     Therapy Documentation Precautions:  Precautions Precautions: Fall, Back, Other (comment) Precaution Comments:  (gout/pain in L UE) Required Braces or Orthoses: Spinal Brace Spinal Brace: Lumbar corset, Applied in sitting position Restrictions Weight Bearing Restrictions: No Other Position/Activity Restrictions: gout L hand and L knee  General: PT Amount of Missed Time (min): 27 Minutes PT Missed Treatment Reason: Pain (gout pain B knees) Pain: Pain Assessment Pain Assessment: 0-10 Pain  Score: 7  Pain Type: Acute pain Pain Location: Knee Pain Orientation: Right;Left Pain Descriptors / Indicators: Aching Pain Onset: On-going Pain Intervention(s): Cold applied;Rest  See Function Navigator for Current Functional Status.   Therapy/Group: Individual Therapy  [name], Prudencio PairRebecca A 09/26/2016, 10:44 AM

## 2016-09-26 NOTE — Progress Notes (Signed)
Occupational Therapy Session Note  Patient Details  Name: Donald Romero MRN: [id number] Date of Birth: 1956-03-21  Today's Date: 09/26/2016  Session 1 OT Individual Time: 1478-2956 OT Individual Time Calculation (min): 53 min   Session 2 OT Individual Time: 1303-1405 OT Individual Time Calculation (min): 62 min    Short Term Goals: Week 1:  OT Short Term Goal 1 (Week 1): Pt will complete LB dressing with AE and Min A OT Short Term Goal 2 (Week 1): Pt will complete 1/3 steps of toileting while adhering to back precautions OT Short Term Goal 3 (Week 1): Pt will complete bathing with Min A with use of AE OT Short Term Goal 4 (Week 1): Pt will complete sit<stand with Min A during BADLs  Skilled Therapeutic Interventions/Progress Updates:  Session 1   Pt greeted in wc reporting gout flare up and pain in B knees. Pt declined any standing 2/2 pain. Treatment session focused on UE there ex, w/c positioning/pressure relief, and wc modifications. Fit wc with elevating leg rests as pt reports increased pain when bending b knees. Pt then propelled wc with increased time and verbal cues for positioning. UB there-ex with orange thera-band bicep curl, triceps press 3 sets of 10 reps. OT discussed pressure relief in wc and applied ice packs to B knees. Pt left with needs met and girlfriend present.     Session 2 OT treatment session focused on functional mobility, dynamic standing balance/ standing endurance. Pt reported improved pain and agreeable to therapy. Log roll, sidelying>sit w/ Max A. Sit<>stand Max A with VC for weight shifting and difficulty weight bearing and bending L knee. Pt ambulated ~150 ft w/ RW, min A,  2 standing and 1 sitting rest break. Pt compensating with hip hike on L LE 2/2 painful knee flexion, . Dynamic standing balance activity using horse shoes, focus on weight shifting and step through with each toss. Pt required min A overall for balance with intermittent mod A for lateral  LOB when stepping with R LE. Pt ambulated back to room at end of session w/ min A, and left seated in wc with R LE elevated. Verbal cues on technique to decreased R knee bend when sitting. Pt left in wc with needs met and girlfriend present.                                                              Therapy Documentation Precautions:  Precautions Precautions: Fall, Back Precaution Comments: gout pain in B knees > LUE Required Braces or Orthoses: Spinal Brace Spinal Brace: Lumbar corset, Applied in sitting position Restrictions Weight Bearing Restrictions: No Other Position/Activity Restrictions: gout L hand and L knee  Pain: Pain Assessment Pain Assessment: 0-10 Pain Score: 10-Worst pain ever Pain Type: Acute pain Pain Location: Knee Pain Orientation: Right;Left Pain Descriptors / Indicators: Aching Pain Frequency: Intermittent Pain Onset: On-going Pain Intervention(s):Repositioned; Emotional support ADL: ADL ADL Comments: Please see functional navigator for ADL status  See Function Navigator for Current Functional Status.   Therapy/Group: Individual Therapy  Valma Cava 09/26/2016, 3:38 PM

## 2016-09-26 NOTE — Progress Notes (Signed)
[facility]IC[facility]ILIT[facility]     PROGRESS NOTE  Subjective/Complaints:  Pt seen sitting up in his chair this AM.  He complains of severe pain in his knees that limit ROM.  He states it has gotten worse.    ROS: +L>R knee pain. Denies CP, SOB, N/V/D.  Objective: Vital Signs: Blood pressure (!) 147/79, pulse 95, temperature 97.2 F (36.2 C), temperature source Oral, resp. rate 16, height 5\' 2"  (1.575 m), SpO2 97 %. No results found. No results for input(s): WBC, HGB, HCT, PLT in the last 72 hours. No results for input(s): NA, K, CL, GLUCOSE, BUN, CREATININE, CALCIUM in the last 72 hours.  Invalid input(s): CO CBG (last 3)  No results for input(s): GLUCAP in the last 72 hours.  Wt Readings from Last 3 Encounters:  09/19/16 74 kg (163 lb 2.3 oz)  09/11/16 75.2 kg (165 lb 11.2 oz)  07/28/16 77.3 kg (170 lb 8 oz)    Physical Exam:  BP (!) 147/79   Pulse 95   Temp 97.2 F (36.2 C) (Oral)   Resp 16   Ht 5\' 2"  (1.575 m)   SpO2 97%  Constitutional: He appears well-developed and well-nourished. No distress.  HENT: Normocephalic and atraumatic.  Eyes: EOMI. No discharge. Cardiovascular: RRR.  No JVD. Respiratory: Effort normal. No respiratory distress.  GI: BS+. He exhibits no distension.  Musculoskeletal: +Edema and tenderness> in left knee.  Neurological: He is alert and oriented.  Motor:  B/l UE 5/5 prox to distal.  B/l LE: 3-/5 HF, 4-/5 KE and 4+/5 ADF/PF. (pain inhibition) Skin: No rash noted. No erythema.  Surgical incision dressed, drain intact.   Psychiatric: He has a normal mood and affect. His behavior is normal. Judgment and thought content normal.    Assessment/Plan: 1. Functional deficits secondary to lumbar scoliosis and radiculopathy s/p L2-S1 decompression with T10 pelvis fusion which require 3+ hours per day of interdisciplinary therapy in a comprehensive inpatient rehab setting. Physiatrist is providing close team supervision and 24 hour  management of active medical problems listed below. Physiatrist and rehab team continue to assess barriers to discharge/monitor patient progress toward functional and medical goals.  Function:  Bathing Bathing position   Position: Wheelchair/chair at sink  Bathing parts Body parts bathed by patient: Right arm, Left arm, Chest, Abdomen, Front perineal area, Buttocks, Right upper leg, Left upper leg Body parts bathed by helper: Right lower leg, Left lower leg, Back  Bathing assist Assist Level:  (Mod A)      Upper Body Dressing/Undressing Upper body dressing   What is the patient wearing?: Pull over shirt/dress, Orthosis     Pull over shirt/dress - Perfomed by patient: Thread/unthread right sleeve, Thread/unthread left sleeve, Put head through opening, Pull shirt over trunk       Orthosis activity level: Performed by helper  Upper body assist Assist Level: Supervision or verbal cues      Lower Body Dressing/Undressing Lower body dressing   What is the patient wearing?: Pants, Non-skid slipper socks     Pants- Performed by patient: Pull pants up/down Pants- Performed by helper: Thread/unthread right pants leg, Thread/unthread left pants leg   Non-skid slipper socks- Performed by helper: Don/doff right sock, Don/doff left sock                  Lower body assist Assist for lower body dressing:  (Max A)      Toileting Toileting Toileting activity did not occur: No continent bowel/bladder  event Toileting steps completed by patient: Adjust clothing prior to toileting, Performs perineal hygiene, Adjust clothing after toileting Toileting steps completed by helper: Adjust clothing prior to toileting, Performs perineal hygiene, Adjust clothing after toileting Toileting Assistive Devices: Grab bar or rail  Toileting assist     Transfers Chair/bed transfer   Chair/bed transfer method: Ambulatory Chair/bed transfer assist level: Moderate assist (Pt 50 - 74%/lift or  lower) Chair/bed transfer assistive device: Bedrails, Armrests     Locomotion Ambulation     Max distance: 90 ft Assist level: Touching or steadying assistance (Pt > 75%)   Wheelchair   Type: Manual Max wheelchair distance: 100 ft Assist Level: Supervision or verbal cues  Cognition Comprehension Comprehension assist level: Understands complex 90% of the time/cues 10% of the time  Expression Expression assist level: Expresses complex 90% of the time/cues < 10% of the time  Social Interaction Social Interaction assist level: Interacts appropriately with others - No medications needed.  Problem Solving Problem solving assist level: Solves complex 90% of the time/cues < 10% of the time  Memory Memory assist level: Complete Independence: No helper    Medical Problem List and Plan: 1.  Functional and mobility deficits secondary to lumbar scoliosis and radiculopathy s/p L2-S1 decompression with T10 pelvis fusion  Cont CIR 2.  DVT Prophylaxis/Anticoagulation: Pharmaceutical: SCDs.   Lovenox to started ~3/24 per NS.  Dopplers neg for DVT 3. Pain Management:  Managed by hydrocodone prn              pain appears well controlled at present in respect to back.  4. Mood: LCSW to follow for evaluation and support.  5. Neuropsych: This patient is capable of making decisions on his own behalf. 6. Skin/Wound Care: Monitor incision for healing. Routine pressure relief measures. Maintain adequate nutritional and hydration status.   Drain d/ced per NS 7. Fluids/Electrolytes/Nutrition: Encourage fluid intake.  8. Gout flare with severe pain:   Steroids ordered x2 days, cont colchicine, pt with rebound flare, Prednisone 40 started 3/20 with plan to taper after improvement over 14 days. 9. Low grade fevers?FUO:  Resolved 10. ABLA: Improved post transfusion. Monitor serially.   Hb 8.9 on 3/17  Cont to monitor 11. Tachycardia: Multifactorial due to pain, anemia and deconditioning.   Improving 12.  Acute on chronic renal failure: Resolving. Avoid nephrotoxic medication and hypotension.  Encourage fluid intake.  Cr 1.68 on 3/17 (around baseline)  Cont to monitor 13. HTN  Hydralazine 25 TID started 3/19 (Home 100), increased to 50 on 3/20 14. Hypoalbuminemia  Supplement initiated 3/19  LOS (Days) 4 A FACE TO FACE EVALUATION WAS PERFORMED  Nakesha Ebrahim Karis Jubanil Ygnacio Fecteau 09/26/2016 8:37 AM

## 2016-09-26 NOTE — Plan of Care (Signed)
Problem: RH PAIN MANAGEMENT Goal: RH STG PAIN MANAGED AT OR BELOW PT'S PAIN GOAL Less than 4  Outcome: Not Progressing Gout flare up- started prednisone today

## 2016-09-27 ENCOUNTER — Inpatient Hospital Stay (HOSPITAL_COMMUNITY): Payer: 59 | Admitting: Occupational Therapy

## 2016-09-27 ENCOUNTER — Inpatient Hospital Stay (HOSPITAL_COMMUNITY): Payer: 59 | Admitting: Physical Therapy

## 2016-09-27 DIAGNOSIS — G8918 Other acute postprocedural pain: Secondary | ICD-10-CM

## 2016-09-27 MED ORDER — BENEPROTEIN PO POWD
1.0000 | Freq: Three times a day (TID) | ORAL | Status: DC
  Administered 2016-09-27 – 2016-09-30 (×8): 6 g via ORAL
  Filled 2016-09-27: qty 227

## 2016-09-27 MED ORDER — CEPHALEXIN 250 MG PO CAPS
500.0000 mg | ORAL_CAPSULE | Freq: Three times a day (TID) | ORAL | Status: DC
  Administered 2016-09-27 – 2016-09-30 (×9): 500 mg via ORAL
  Filled 2016-09-27 (×9): qty 2

## 2016-09-27 NOTE — Progress Notes (Signed)
Social Work Patient ID: Donald Romero, male   DOB: 01/08/1956, 61 y.o.   MRN: [id number]  Met with pt and girlfriend-[name] to inform team conference goals mod/I level and target discharge 3/24. He is very pleased With this plan. He and [name] feel he will be ready for discharge Sat.  Will get equipment recommendations from team and need for follow up therapies.

## 2016-09-27 NOTE — Progress Notes (Signed)
Physical Therapy Session Note  Patient Details  Name: Donald Romero MRN: [id number] Date of Birth: [birth year]  Today's Date: 09/27/2016 PT Individual Time: 1105-1200 and 1500-1608 PT Individual Time Calculation (min): 55 min and 68 min  Short Term Goals: Week 1:  PT Short Term Goal 1 (Week 1): Pt will increase bed mobility with side rails to S.  PT Short Term Goal 2 (Week 1): Pt will transfers supine to edge of bed, edge of bed to supine with min A.  PT Short Term Goal 3 (Week 1): Pt will increase transfers with rolling walker to min guard.  PT Short Term Goal 4 (Week 1): Pt will increase ambulation with rolling walker to min guard about 100 feet.  PT Short Term Goal 5 (Week 1): Pt will ascend.descend 4 stairs with B rails and min A.   Skilled Therapeutic Interventions/Progress Updates:   Treatment 1: Patient in wheelchair with brace donned with [name] present throughout session. Patient maintained ice pack on L knee throughout session and reported overall improvement in gout pain compared to yesterday with improved ability to participate. Session focused on gait training throughout rehab unit on tile and carpet using RW > 200 ft, stair training up/down 12 (6") stairs using 2 rails with step-to pattern, bed mobility on regular bed in ADL apartment, and simulated car transfer to SUV height using RW with supervision. Patient required min A for sit <> stand from lower surfaces using RW including compliant furniture surface x 2 during session but supervision from standard height chairs. Utilized [employer] to challenge dynamic standing balance with intermittent UE support as needed for 3 x 3 min trials with score of 127, 170, and 168 respectively. Patient required supervision overall except 1 LOB during first trial requiring max A to recover due to c/o "foot slipping" wearing gripper socks. Patient's significant other demonstrated safely taking patient to bathroom including w/c parts management, safety  plan updated. Patient left sitting in wheelchair with needs in reach and [name] present.   Treatment 2: Patient in wheelchair with brace donned and no ice pack, reporting improved L knee pain. Sit <> stand transfers from low wheelchair seat with supervision and increased time. Gait training throughout rehab unit using RW with supervision. Performed NuStep using BUE/BLE at level 1 > 3 with focus on B knee ROM and strengthening within tolerable L knee flexion range x 8 min. BLE therex using RW for BUE support: seated LAQ, standing alt hip flexion, standing heel raises, standing knee flexion, sit <> stand x 10 without UE support, seated isometric hip adduction. Patient able to perform sit <> stand from wheelchair and mat table in lowest position without UE support with supervision. Standing without UE support with boxing gloves donned, patient engaged in BUE strengthening, endurance and standing balance x 3 trials to fatigue while hitting punching bag without breaking back precautions. Instructed in 6 Minute Walk Test using RW with 1 seated rest break = 490 ft. Patient ambulated 700 ft total with supervision back to room. Patient left sitting in wheelchair with all needs in reach and significant other present.    Therapy Documentation Precautions:  Precautions Precautions: Fall, Back Precaution Comments: gout pain in B knees > LUE Required Braces or Orthoses: Spinal Brace Spinal Brace: Lumbar corset, Applied in sitting position Restrictions Weight Bearing Restrictions: No Other Position/Activity Restrictions: gout L hand and L knee  Pain: "Terrible pain" in L knee with flexion, provided rest and ambulation  See Function Navigator for Current Functional Status.  Therapy/Group: Individual Therapy  Myasia Sinatra, [name] 09/27/2016, 12:31 PM

## 2016-09-27 NOTE — Discharge Summary (Signed)
Physician Discharge Summary  Patient ID: Donald Romero MRN: [id number] DOB/AGE: [birth year] 61 y.o.  Admit date: 09/22/2016 Discharge date: 09/30/2016  Discharge Diagnoses:  Principal Problem:   Scoliosis of lumbar spine Active Problems:   Acute idiopathic gout of left hand   Acute blood loss anemia   Tachycardia   Stage 3 chronic kidney disease   Benign essential HTN   Hypoalbuminemia due to protein-calorie malnutrition (HCC)   Acute idiopathic gout of left knee   Post-operative pain   AKI (acute kidney injury) (HCC)   Discharged Condition:  Stable.    Significant Diagnostic Studies: N/A   Labs:  Basic Metabolic Panel: BMP Latest Ref Rng & Units 09/29/2016 09/23/2016 09/22/2016  Glucose 65 - 99 mg/dL 621(H) 086(V) 784(O)  BUN 6 - 20 mg/dL 96(E) 20 95(M)  Creatinine 0.61 - 1.24 mg/dL 8.41(L) 2.44(W) 1.02(V)  Sodium 135 - 145 mmol/L 136 137 138  Potassium 3.5 - 5.1 mmol/L 3.8 4.3 4.4  Chloride 101 - 111 mmol/L 97(L) 102 105  CO2 22 - 32 mmol/L 26 25 25   Calcium 8.9 - 10.3 mg/dL 9.3 2.5(D) 6.6(Y)    CBC: CBC Latest Ref Rng & Units 09/29/2016 09/23/2016 09/22/2016  WBC 4.0 - 10.5 K/uL 11.8(H) 9.2 10.2  Hemoglobin 13.0 - 17.0 g/dL 4.0(H) 8.9(L) 8.7(L)  Hematocrit 39.0 - 52.0 % 28.5(L) 26.9(L) 26.2(L)  Platelets 150 - 400 K/uL 474(H) 186 154    CBG: No results for input(s): GLUCAP in the last 168 hours.   Brief HPI:   Donald Romero a 61 y.o.malewith history of HTN, CKD stage III, idiopathic scoliosis treated with T2- L2 fusion at age 61 and now with progressive worsening of back and RLE>LLE pain. He was found to have scoliosis and lumbosacral spondylosis with radiculopathy and elected to undergo T 10- pelvis fixation and fusion with L2 to S1 decompression by Dr. Bevely Palmer. Hospital course significant for acute on chronic renal failure with hypotension, ABLA requiring transfusion with 1 unit PRBC for drop in Hgb to 6.3 as well as left hand and left knee gout flare.  Therapy  ongoing and patient noted to have decreased WB on LLE, decreased balance with posterior lean and weakness affecting mobility and ability to carry out ADL tasks. CIR recommended for follow up therapy.    Hospital Course: Donald Romero was admitted to rehab 09/22/2016 for inpatient therapies to consist of PT, ST and OT at least three hours five days a week. Past admission physiatrist, therapy team and rehab RN have worked together to provide customized collaborative inpatient rehab.  He was treated with steroids X 48 hours without improvement in symptoms.  He continued to have low grade fevers with leucocytosis and BLE dopplers were negative for DVT. He continued to be limited by gout flare affecting left hand and progressing to involve bilateral knees. He was stared on 14 day steroid dose pack with resolution of flare and improvement in mobility. Follow up labs showed evidence of dehydration. Renal status which had improved with hydration after surgery now shows that SCr is trending upwards towards baseline. He was encouraged to increase fluid intake and have follow up BMET rechecked in 1-2 weeks.  Back drain was still in place at admission and was removed on 3/19. Surgical incision is intact and healing well without signs or symptoms of infection.  Follow up CBC shows ABLA slowly improving and thrombocytopenia has resolved. Blood pressures have been monitored on bid basis and hydralazine was resumed and slowly titrated to  50 mg tid. He has progressed to modified independent level and will continue to receive follow up HHPT and HHOT by    Rehab course: During patient's stay in rehab weekly team conferences were held to monitor patient's progress, set goals and discuss barriers to discharge. At admission, patient required mod assist with mobility and self care tasks. He has had improvement in activity tolerance, balance, postural control, as well as ability to compensate for deficits. He is able to complete ADL  tasks at modified independent level. He is modified independent for transfers and to ambulate up to 650' with RW. Family education was completed with care partner who will assist as needed after discharge.     Disposition: 01-Home or Self Care  Diet: Regular.   Special Instructions: 1. No driving, lifting or strenuous activity till cleared by MD. 2. No bending, twisting or arching.  3. Recheck  BMET in 1-2 weeks.    Allergies as of 09/30/2016   No Known Allergies     Medication List    STOP taking these medications   indomethacin 25 MG capsule Commonly known as:  INDOCIN     TAKE these medications   acetaminophen 650 MG CR tablet Commonly known as:  TYLENOL Take 650-1,300 mg by mouth every 8 (eight) hours as needed for pain.   gabapentin 300 MG capsule Commonly known as:  NEURONTIN Take 1 capsule (300 mg total) by mouth 3 (three) times daily.   hydrALAZINE 25 MG tablet Commonly known as:  APRESOLINE Take 3 tablets (75 mg total) by mouth every 8 (eight) hours. What changed:  medication strength  how much to take  when to take this   methocarbamol 500 MG tablet Commonly known as:  ROBAXIN Take 1 tablet (500 mg total) by mouth every 6 (six) hours as needed for muscle spasms. What changed:  medication strength  how much to take  when to take this   oxyCODONE-acetaminophen 7.5-325 MG tablet--Rx # 30 pills  Commonly known as:  PERCOCET Take 1-2 tablets by mouth every 4 (four) hours as needed for severe pain.   predniSONE 10 MG tablet Commonly known as:  DELTASONE Three tab daily for three days then two tabs daily for three days then one tab daily for three days and stop      Follow-up Information    Ankit Karis JubaAnil Patel, MD Follow up.   Specialty:  Physical Medicine and Rehabilitation Why:  office will call you with follow up appointment Contact information: [address] [place] [address] [phone]        Loura HaltBenjamin Jared Ditty, MD.  Call in 1 day(s).   Specialty:  Neurosurgery Why:  for follow up appointment Contact information: [address] [place] [address] [phone]           Signed: Jacquelynn CreeLove, Jonuel Butterfield S 10/02/2016, 5:15 PM

## 2016-09-27 NOTE — Patient Care Conference (Signed)
Inpatient RehabilitationTeam Conference and Plan of Care Update Date: 09/27/2016   Time: 2:00 PM    Patient Name: Donald CarnesJames R Romero      Medical Record Number: [id number]  Date of Birth: February 08, 1956 Sex: Male         Room/Bed: 4M05C/4M05C-01 Payor Info: Payor: [name] / Plan: [name] NAP / Product Type: *No Product type* /    Admitting Diagnosis: Debility  Admit Date/Time:  09/22/2016  5:18 PM Admission Comments: No comment available   Primary Diagnosis:  Scoliosis of lumbar spine Principal Problem: Scoliosis of lumbar spine  Patient Active Problem List   Diagnosis Date Noted  . Post-operative pain   . Acute idiopathic gout of left knee   . Acute blood loss anemia   . Tachycardia   . Stage 3 chronic kidney disease   . Benign essential HTN   . Hypoalbuminemia due to protein-calorie malnutrition (HCC)   . Acute idiopathic gout of left hand   . Scoliosis of lumbar spine 09/22/2016  . Acute respiratory failure with hypoxia (HCC)   . Scoliosis 09/18/2016  . Postoperative respiratory failure (HCC) 09/18/2016  . Sinus tachycardia 09/18/2016  . Acute post-operative pain 09/18/2016  . Goals of care, counseling/discussion 09/18/2016    Expected Discharge Date: Expected Discharge Date: 09/30/16  Team Members Present: Physician leading conference: Dr. Maryla MorrowAnkit Patel Social Worker Present: Dossie DerBecky Lankford Gutzmer, LCSW Nurse Present: Carmie EndAngie Joyce, RN PT Present: Bayard Huggerebecca Varner, PT OT Present: Kearney HardElisabeth Doe, OT PPS Coordinator present : Tora DuckMarie Noel, RN, CRRN     Current Status/Progress Goal Weekly Team Focus  Medical   Functional and mobility deficits secondary to lumbar scoliosis and radiculopathy s/p L2-S1 decompression with T10 pelvis fusion  Improve mobility, tachycardia, HTN, gout  See above   Bowel/Bladder   continent of b/b LBM 3/19.  maintain b/b with min assist  monitor q shift and prn   Swallow/Nutrition/ Hydration             ADL's   Min/Mod A Overall  Mod I overall  back precautions,  modified bathing/dressing, strengthening/endurance, pt/family education   Mobility   Min-mod A, limited by gout pain B knees  mod I except S stairs  mobility, maintaining precautions, balance, strengthening, endurance, pt education   Communication             Safety/Cognition/ Behavioral Observations            Pain   recent increase in pain due to gout sx. norco/robaxin prn to control back pain and also for gout to knees bilat, prednisone started for gout pain.  pain </= 4  montior pain q shift and prn   Skin                *See Care Plan and progress notes for long and short-term goals.  Barriers to Discharge: Gout flare with rebound gout, ABLA, HTN, tachycardia    Possible Resolutions to Barriers:  Steroids initiated, optimize BP, follow labs    Discharge Planning/Teaching Needs:  Home with friend/roommate and sister to be checking on him. Wants to be mod/i before DC      Team Discussion:  Goals mod/I level except with stairs. Can don brace while sitting. Gout flare in bilateral knees-medications helping. Endurance issues. Girlfriend here daily and is participating in therapies with pt.   Revisions to Treatment Plan:  DC 3/24   Continued Need for Acute Rehabilitation Level of Care: The patient requires daily medical management by a physician with specialized training in  physical medicine and rehabilitation for the following conditions: Daily direction of a multidisciplinary physical rehabilitation program to ensure safe treatment while eliciting the highest outcome that is of practical value to the patient.: Yes Daily medical management of patient stability for increased activity during participation in an intensive rehabilitation regime.: Yes Daily analysis of laboratory values and/or radiology reports with any subsequent need for medication adjustment of medical intervention for : Post surgical problems;Other;Blood pressure problems  Donald Romero 09/27/2016, 4:19 PM

## 2016-09-27 NOTE — Progress Notes (Signed)
[facility]     PROGRESS NOTE  Subjective/Complaints:  Pt seen sitting up in his wheelchair this AM. He states his pain is much better. He slept well overnight.  He also requests change in his protein supplementation.   ROS: Bilateral knee pain. Denies CP, SOB, N/V/D.  Objective: Vital Signs: Blood pressure 130/62, pulse 89, temperature 99 F (37.2 C), temperature source Oral, resp. rate 16, height 5\' 2"  (1.575 m), SpO2 93 %. No results found. No results for input(s): WBC, HGB, HCT, PLT in the last 72 hours. No results for input(s): NA, K, CL, GLUCOSE, BUN, CREATININE, CALCIUM in the last 72 hours.  Invalid input(s): CO CBG (last 3)  No results for input(s): GLUCAP in the last 72 hours.  Wt Readings from Last 3 Encounters:  09/19/16 74 kg (163 lb 2.3 oz)  09/11/16 75.2 kg (165 lb 11.2 oz)  07/28/16 77.3 kg (170 lb 8 oz)    Physical Exam:  BP 130/62   Pulse 89   Temp 99 F (37.2 C) (Oral)   Resp 16   Ht 5\' 2"  (1.575 m)   SpO2 93%  Constitutional: He appears well-developed and well-nourished. No distress.  HENT: Normocephalic and atraumatic.  Eyes: EOMI. No discharge. Cardiovascular: RRR.  No JVD. Respiratory: Effort normal. No respiratory distress.  GI: BS+. He exhibits no distension.  Musculoskeletal: +Edema and tenderness> in bilateral knee, improving.  Neurological: He is alert and oriented.  Motor:  B/l UE 5/5 prox to distal.  B/l LE: 3/5 HF, 4/5 KE and 4+/5 ADF/PF. (pain inhibition) Skin: No rash noted. No erythema.  Surgical incision dressed, drain intact.   Psychiatric: He has a normal mood and affect. His behavior is normal. Judgment and thought content normal.    Assessment/Plan: 1. Functional deficits secondary to lumbar scoliosis and radiculopathy s/p L2-S1 decompression with T10 pelvis fusion which require 3+ hours per day of interdisciplinary therapy in a comprehensive inpatient rehab setting. Physiatrist is providing  close team supervision and 24 hour management of active medical problems listed below. Physiatrist and rehab team continue to assess barriers to discharge/monitor patient progress toward functional and medical goals.  Function:  Bathing Bathing position   Position: Wheelchair/chair at sink  Bathing parts Body parts bathed by patient: Right arm, Left arm, Chest, Abdomen, Front perineal area, Buttocks, Right upper leg, Left upper leg Body parts bathed by helper: Right lower leg, Left lower leg, Back  Bathing assist Assist Level:  (Mod A)      Upper Body Dressing/Undressing Upper body dressing   What is the patient wearing?: Pull over shirt/dress, Orthosis     Pull over shirt/dress - Perfomed by patient: Thread/unthread right sleeve, Thread/unthread left sleeve, Put head through opening, Pull shirt over trunk       Orthosis activity level: Performed by helper  Upper body assist Assist Level: Supervision or verbal cues      Lower Body Dressing/Undressing Lower body dressing   What is the patient wearing?: Pants, Non-skid slipper socks     Pants- Performed by patient: Pull pants up/down Pants- Performed by helper: Thread/unthread right pants leg, Thread/unthread left pants leg   Non-skid slipper socks- Performed by helper: Don/doff right sock, Don/doff left sock                  Lower body assist Assist for lower body dressing:  (Max A)      Toileting Toileting Toileting activity did not occur: No continent bowel/bladder event  Toileting steps completed by patient: Adjust clothing prior to toileting, Performs perineal hygiene, Adjust clothing after toileting Toileting steps completed by helper: Adjust clothing prior to toileting, Performs perineal hygiene, Adjust clothing after toileting Toileting Assistive Devices: Grab bar or rail  Toileting assist Assist level: More than reasonable time, Set up/obtain supplies, Supervision or verbal cues   Transfers Chair/bed  transfer   Chair/bed transfer method: Ambulatory Chair/bed transfer assist level: Moderate assist (Pt 50 - 74%/lift or lower) Chair/bed transfer assistive device: Bedrails, Armrests     Locomotion Ambulation     Max distance: 90 ft Assist level: Touching or steadying assistance (Pt > 75%)   Wheelchair   Type: Manual Max wheelchair distance: 100 ft Assist Level: Supervision or verbal cues  Cognition Comprehension Comprehension assist level: Follows complex conversation/direction with extra time/assistive device  Expression Expression assist level: Expresses complex ideas: With extra time/assistive device  Social Interaction Social Interaction assist level: Interacts appropriately with others - No medications needed.  Problem Solving Problem solving assist level: Solves complex 90% of the time/cues < 10% of the time  Memory Memory assist level: Complete Independence: No helper    Medical Problem List and Plan: 1.  Functional and mobility deficits secondary to lumbar scoliosis and radiculopathy s/p L2-S1 decompression with T10 pelvis fusion  Cont CIR 2.  DVT Prophylaxis/Anticoagulation: Pharmaceutical: SCDs.   Lovenox to start ~3/24 per NS.  Dopplers neg for DVT 3. Pain Management:  Managed by hydrocodone prn              pain appears well controlled at present in respect to back.  4. Mood: LCSW to follow for evaluation and support.  5. Neuropsych: This patient is capable of making decisions on his own behalf. 6. Skin/Wound Care: Monitor incision for healing. Routine pressure relief measures. Maintain adequate nutritional and hydration status.   Drain d/ced per NS 7. Fluids/Electrolytes/Nutrition: Encourage fluid intake.  8. Gout flare with severe pain:   Pt with rebound flare, after prednisone d/ced 3/18.   Prednisone 40 started 3/20 with plan to taper after improvement over 14 days. 9. Low grade fevers?FUO:  Resolved 10. ABLA: Improved post transfusion. Monitor serially.   Hb  8.9 on 3/17  Cont to monitor 11. Tachycardia: Multifactorial due to pain, anemia and deconditioning.   Improving 3/21 12. Acute on chronic renal failure: Resolving. Avoid nephrotoxic medication and hypotension.  Encourage fluid intake.  Cr 1.68 on 3/17 (around baseline)  Cont to monitor 13. HTN  Hydralazine 25 TID started 3/19 (Home 100), increased to 50 on 3/20  Controlled 3/21 14. Hypoalbuminemia  Supplement initiated 3/19  LOS (Days) 5 A FACE TO FACE EVALUATION WAS PERFORMED  Ankit Karis Juba 09/27/2016 10:25 AM

## 2016-09-27 NOTE — Progress Notes (Signed)
Occupational Therapy Session Note  Patient Details  Name: Donald Romero MRN: [id number] Date of Birth: December 14, 1955  Today's Date: 09/27/2016 OT Individual Time: 0900-1000 OT Individual Time Calculation (min): 60 min    Short Term Goals: Week 1:  OT Short Term Goal 1 (Week 1): Pt will complete LB dressing with AE and Min A OT Short Term Goal 2 (Week 1): Pt will complete 1/3 steps of toileting while adhering to back precautions OT Short Term Goal 3 (Week 1): Pt will complete bathing with Min A with use of AE OT Short Term Goal 4 (Week 1): Pt will complete sit<stand with Min A during BADLs  Skilled Therapeutic Interventions/Progress Updates:    OT treatment session focused on dynamic standing balance, endurance, improved sit<>stand. Sit<>stand from wc with Mod A, ambulated to bathroom and stood to urinate with CGA for balance 2/2 posterior LOB. Washed hands at the sink, with supervision for balance using hip support for balance strategy. Pt ambulated to therapy gym. LB strength/coordination/balance with alternating toe touch to small cone. Dynamic standing balance using foam platform while playing checkers game, with min guard A and tactile cues for balance strategies. Obstacle course with focus on walker positioning and coordination with tight turns and foot clearance to step over small obstacle. Pt left seated in wc at end of session with ice pack on L knee and needs met.   Therapy Documentation Precautions:  Precautions Precautions: Fall, Back Precaution Comments: gout pain in B knees > LUE Required Braces or Orthoses: Spinal Brace Spinal Brace: Lumbar corset, Applied in sitting position Restrictions Weight Bearing Restrictions: No Other Position/Activity Restrictions: gout L hand and L knee  Pain: Pain Assessment Pain Assessment: 0-10 Pain Score: 4  Pain Type: Acute pain Pain Location: Knee Pain Orientation: Left Pain Descriptors / Indicators: Aching Pain Frequency: Constant Pain  Onset: With Activity Patients Stated Pain Goal: 3 Pain Intervention(s): Repositioned ADL: ADL ADL Comments: Please see functional navigator for ADL status  See Function Navigator for Current Functional Status.   Therapy/Group: Individual Therapy  Valma Cava 09/27/2016, 1:03 PM

## 2016-09-28 ENCOUNTER — Inpatient Hospital Stay (HOSPITAL_COMMUNITY): Payer: 59

## 2016-09-28 ENCOUNTER — Inpatient Hospital Stay (HOSPITAL_COMMUNITY): Payer: 59 | Admitting: Physical Therapy

## 2016-09-28 NOTE — Progress Notes (Signed)
Physical Therapy Session Note  Patient Details  Name: Donald Romero MRN: [id number] Date of Birth: 02/20/1956  Today's Date: 09/28/2016 PT Individual Time: 0900-0955 PT Individual Time Calculation (min): 55 min   Short Term Goals: Week 1:  PT Short Term Goal 1 (Week 1): Pt will increase bed mobility with side rails to S.  PT Short Term Goal 2 (Week 1): Pt will transfers supine to edge of bed, edge of bed to supine with min Romero.  PT Short Term Goal 3 (Week 1): Pt will increase transfers with rolling walker to min guard.  PT Short Term Goal 4 (Week 1): Pt will increase ambulation with rolling walker to min guard about 100 feet.  PT Short Term Goal 5 (Week 1): Pt will ascend.descend 4 stairs with B rails and min Romero.   Skilled Therapeutic Interventions/Progress Updates:   Patient in wheelchair upon arrival, continued report of improvement in gout pain and Donald Romero present for session. Gait training throughout rehab unit from room to rehab gym to and from hospital gift shop using RW with distant supervision and verbal cues for safely maneuvering RW across thresholds on/off elevator to maintain back precautions due to placing RW too far away from body. Patient ambulated throughout carpeted gift shop using RW with supervision. Patient practiced sit <> stand from armless chair in atrium with supervision and min cues for safe hand placement and not keeping BUE on RW. Seated EOM, patient demonstrated donning/doffing LSO with mod I. Stair training up/down 12 (6") stairs using 2 rails with reciprocal pattern and supervision. Instructed in 5TSS without UE support from standard arm chair = 27 sec. Performed NuStep using BUE/BLE at level 6 x 5 min for generalized strengthening and endurance. Patient ambulated back to room and left sitting in wheelchair with girlfriend present and needs in reach.   Five times Sit to Stand Test (FTSS) Method: Use Romero straight back chair with Romero solid seat that is 16-18" high. Ask  participant to sit on the chair with arms folded across their chest.   Instructions: "Stand up and sit down as quickly as possible 5 times, keeping your arms folded across your chest."   Measurement: Stop timing when the participant stands the 5th time.  TIME: ___27___ (in seconds)  Times > 13.6 seconds is associated with increased disability and morbidity (Donald Romero, 2000) Times > 15 seconds is predictive of recurrent falls in healthy individuals aged 61 and older (Donald Romero, et al., 2008) Normal performance values in community dwelling individuals aged 61 and older (Donald Romero, Romero): o 60-69 years: 11.4 seconds o 70-79 years: 12.6 seconds o 80-89 years: 14.8 seconds  MCID: ? 2.3 seconds for Vestibular Disorders Donald Romero(Donald Romero)   Therapy Documentation Precautions:  Precautions Precautions: Fall, Back Precaution Comments: gout pain in B knees > LUE Required Braces or Orthoses: Spinal Brace Spinal Brace: Lumbar corset, Applied in sitting position Restrictions Weight Bearing Restrictions: No Other Position/Activity Restrictions: gout L hand and L knee  Pain: C/o unrated L knee pain with NuStep, provided rest and ambulation  See Function Navigator for Current Functional Status.   Therapy/Group: Individual Therapy  [name], Donald Romero 09/28/2016, 9:56 AM

## 2016-09-28 NOTE — Progress Notes (Signed)
Physical Therapy Note  Patient Details  Name: Donald Romero MRN: [id number] Date of Birth: [birth year] Today's Date: 09/28/2016  1330-1545, 75 min individual tx Pain: 4/10 low back; premedicated  Gait on level tile and carpet, numerous thresholds, with RW, supervision, with safe use of RW over thresholds of carpet>< tile. Discussed use of RW over thresholld at home requiring lifting RW over it.  Seated neuromuscular re-education via forced use, multimodal cues for bil 10 x 2 with forefeet on wedge for soleus stretch; bil hip adduction x 15 for decreasing bil hip external rotation..  Standing on wedge x 2 minutes for hamstring and heel cord stretch bil with intermittent bil UEsupport on RW in front. Gait over mat on floor for uneven surfaces, x 4, with 8 thresholds; mod cues fading to 0 cues, min assist fading to min guard assist. 1 set of 10 [place] level C strengthening : Standing calf raises, toe raises,  R/L hip abduction; R/L hamstring curls,  with bil UE support., and seated long arc quad knee ext with ankle DF at end range for heel cord stretch. Balance challenge standing on Airex firm mat with bil UE fading to 0UE; pt able to hold balance x 10 seconds on firm mat without UE support.  Standing on softer Airex mat pt able to perform 10 x 1 mini squats with bil UE support; static standing x 1 minute with 1UE support.  Unable to balance without UEs on softer mat.  Gait to return to room; pt left resting in w/c with girlfriend in attendance and all needs within reach.  See function navigator for current status.   Sharnise Blough 09/28/2016, 7:58 AM

## 2016-09-28 NOTE — Progress Notes (Signed)
[place] PHYSICAL MEDICINE & REHABILITATION     PROGRESS NOTE  Subjective/Complaints:  Pt seen sitting up in his chair this AM. He slept well overnight and states he feels even better today.  He states he will not need ice packs during therapies.   ROS: Denies CP, SOB, N/V/D.  Objective: Vital Signs: Blood pressure 127/73, pulse 96, temperature 98.3 F (36.8 C), temperature source Oral, resp. rate 18, height 5\' 2"  (1.575 m), SpO2 95 %. No results found. No results for input(s): WBC, HGB, HCT, PLT in the last 72 hours. No results for input(s): NA, K, CL, GLUCOSE, BUN, CREATININE, CALCIUM in the last 72 hours.  Invalid input(s): CO CBG (last 3)  No results for input(s): GLUCAP in the last 72 hours.  Wt Readings from Last 3 Encounters:  09/19/16 74 kg (163 lb 2.3 oz)  09/11/16 75.2 kg (165 lb 11.2 oz)  07/28/16 77.3 kg (170 lb 8 oz)    Physical Exam:  BP 127/73 (BP Location: Left Arm)   Pulse 96   Temp 98.3 F (36.8 C) (Oral)   Resp 18   Ht 5\' 2"  (1.575 m)   SpO2 95%  Constitutional: He appears well-developed and well-nourished. No distress.  HENT: Normocephalic and atraumatic.  Eyes: EOMI. No discharge. Cardiovascular: RRR.  No JVD. Respiratory: Effort normal. No respiratory distress.  GI: BS+. He exhibits no distension.  Musculoskeletal: +Edema and tenderness in bilateral knee, improved.  Neurological: He is alert and oriented.  Motor:  B/l UE 5/5 prox to distal.  B/l LE: 4/5 HF, 4+/5 KE and 4+/5 ADF/PF.  Skin: No rash noted. No erythema.  Surgical incision c/d/i Psychiatric: He has a normal mood and affect. His behavior is normal. Judgment and thought content normal.    Assessment/Plan: 1. Functional deficits secondary to lumbar scoliosis and radiculopathy s/p L2-S1 decompression with T10 pelvis fusion which require 3+ hours per day of interdisciplinary therapy in a comprehensive inpatient rehab setting. Physiatrist is providing close team supervision and 24 hour  management of active medical problems listed below. Physiatrist and rehab team continue to assess barriers to discharge/monitor patient progress toward functional and medical goals.  Function:  Bathing Bathing position   Position: Wheelchair/chair at sink  Bathing parts Body parts bathed by patient: Right arm, Left arm, Chest, Abdomen, Front perineal area, Buttocks, Right upper leg, Left upper leg Body parts bathed by helper: Right lower leg, Left lower leg, Back  Bathing assist Assist Level:  (Mod A)      Upper Body Dressing/Undressing Upper body dressing   What is the patient wearing?: Pull over shirt/dress, Orthosis     Pull over shirt/dress - Perfomed by patient: Thread/unthread right sleeve, Thread/unthread left sleeve, Put head through opening, Pull shirt over trunk       Orthosis activity level: Performed by helper  Upper body assist Assist Level: Supervision or verbal cues      Lower Body Dressing/Undressing Lower body dressing   What is the patient wearing?: Pants, Non-skid slipper socks     Pants- Performed by patient: Pull pants up/down Pants- Performed by helper: Thread/unthread right pants leg, Thread/unthread left pants leg   Non-skid slipper socks- Performed by helper: Don/doff right sock, Don/doff left sock                  Lower body assist Assist for lower body dressing:  (Max A)      Toileting Toileting Toileting activity did not occur: No continent bowel/bladder event Toileting steps  completed by patient: Adjust clothing prior to toileting, Performs perineal hygiene, Adjust clothing after toileting Toileting steps completed by helper: Adjust clothing prior to toileting, Performs perineal hygiene, Adjust clothing after toileting Toileting Assistive Devices: Grab bar or rail  Toileting assist Assist level: More than reasonable time, Set up/obtain supplies, Supervision or verbal cues   Transfers Chair/bed transfer   Chair/bed transfer method:  Ambulatory Chair/bed transfer assist level: Supervision or verbal cues Chair/bed transfer assistive device: Walker, Designer, fashion/clothingArmrests     Locomotion Ambulation     Max distance: 200 ft Assist level: Supervision or verbal cues   Wheelchair   Type: Manual Max wheelchair distance: 100 ft Assist Level: Supervision or verbal cues  Cognition Comprehension Comprehension assist level: Follows complex conversation/direction with extra time/assistive device  Expression Expression assist level: Expresses complex ideas: With extra time/assistive device  Social Interaction Social Interaction assist level: Interacts appropriately with others - No medications needed.  Problem Solving Problem solving assist level: Solves complex 90% of the time/cues < 10% of the time  Memory Memory assist level: Complete Independence: No helper    Medical Problem List and Plan: 1.  Functional and mobility deficits secondary to lumbar scoliosis and radiculopathy s/p L2-S1 decompression with T10 pelvis fusion  Cont CIR 2.  DVT Prophylaxis/Anticoagulation: Pharmaceutical: SCDs.   Lovenox to start ~3/24 per NS.  Dopplers neg for DVT 3. Pain Management:  Managed by hydrocodone prn              pain appears well controlled at present in respect to back.  4. Mood: LCSW to follow for evaluation and support.  5. Neuropsych: This patient is capable of making decisions on his own behalf. 6. Skin/Wound Care: Monitor incision for healing. Routine pressure relief measures. Maintain adequate nutritional and hydration status.   Drain d/ced per NS 7. Fluids/Electrolytes/Nutrition: Encourage fluid intake.  8. Gout flare with severe pain:   Pt with rebound flare, after prednisone d/ced 3/18.   Prednisone 40 started 3/20 with plan to taper after improvement over 14 days. 9. Low grade fevers?FUO:  Resolved 10. ABLA: Improved post transfusion. Monitor serially.   Hb 8.9 on 3/17  Labs ordered for tomorrow  Cont to monitor 11.  Tachycardia: Multifactorial due to pain, anemia and deconditioning.   Improved 3/22 12. Acute on chronic renal failure: Resolving. Avoid nephrotoxic medication and hypotension.  Encourage fluid intake.  Cr 1.68 on 3/17 (around baseline)  Cont to monitor  Labs ordered for tomorrow 13. HTN  Hydralazine 25 TID started 3/19 (Home 100), increased to 50 on 3/20  Controlled 3/22 14. Hypoalbuminemia  Supplement initiated 3/19  LOS (Days) 6 A FACE TO FACE EVALUATION WAS PERFORMED  Rosario Duey Karis Jubanil Arleth Mccullar 09/28/2016 8:18 AM

## 2016-09-28 NOTE — Progress Notes (Signed)
Occupational Therapy Session Note  Patient Details  Name: Donald Romero MRN: [id number] Date of Birth: 1955/09/08  Today's Date: 09/28/2016 OT Individual Time: 1100-1157 OT Individual Time Calculation (min): 57 min    Short Term Goals: Week 1:  OT Short Term Goal 1 (Week 1): Pt will complete LB dressing with AE and Min A OT Short Term Goal 2 (Week 1): Pt will complete 1/3 steps of toileting while adhering to back precautions OT Short Term Goal 3 (Week 1): Pt will complete bathing with Min A with use of AE OT Short Term Goal 4 (Week 1): Pt will complete sit<stand with Min A during BADLs  Skilled Therapeutic Interventions/Progress Updates:    Pt seen seated in w/c with wife present and agreeable to tx. Pt complain of pain in midst of session and RN notified/administered medication. Focus of session on functional mobility, balance and pt requested to work on steps. Pt ambulate to/from all therapeutic destinations with increased time and supervision. Pt completes 10 sit to stand with supervision-CGA with 1 LE elevated onto block to improve BLE strength/endurance. With MIN A for balance, pt stands and brings foot up to touch numbers on block to improve balance and weight shifting onto one foot. With HHA and MIN A for balance and VC for weight shifting, Pt steps up and over step to improve BLE strength and balance required to complete stairs and required for LB dressing. Pt stands for ~6 min on soft mat to complete pipe tree activity, with supervision to challenge balance, improve FMC of B hands and improve endurance. In room, educated pt on adaptive equipment to don shoes.  Attempted to use shoe funnel to don shoes however, Pt shoes too small. Wife to bring new pair tomorrow. Pt doff socks with Vc to use reacher, and don non slip socks with sock aide with supervision. Left pt seated in w/c with wife in room and call light in reach.  Therapy Documentation Precautions:  Precautions Precautions: Fall,  Back Precaution Comments: gout pain in B knees > LUE Required Braces or Orthoses: Spinal Brace Spinal Brace: Lumbar corset, Applied in sitting position Restrictions Weight Bearing Restrictions: No Other Position/Activity Restrictions: gout L hand and L knee  General:   Vital Signs:   Pain: 6/10. RN Aware and administer medication ADL: ADL ADL Comments: Please see functional navigator for ADL status Exercises:   Other Treatments:    See Function Navigator for Current Functional Status.   Therapy/Group: Individual Therapy  Donald Romero 09/28/2016, 12:15 PM

## 2016-09-29 ENCOUNTER — Inpatient Hospital Stay (HOSPITAL_COMMUNITY): Payer: 59 | Admitting: Occupational Therapy

## 2016-09-29 ENCOUNTER — Inpatient Hospital Stay (HOSPITAL_COMMUNITY): Payer: 59 | Admitting: Physical Therapy

## 2016-09-29 DIAGNOSIS — N179 Acute kidney failure, unspecified: Secondary | ICD-10-CM

## 2016-09-29 LAB — CBC
HCT: 28.5 % — ABNORMAL LOW (ref 39.0–52.0)
Hemoglobin: 9.3 g/dL — ABNORMAL LOW (ref 13.0–17.0)
MCH: 29.1 pg (ref 26.0–34.0)
MCHC: 32.6 g/dL (ref 30.0–36.0)
MCV: 89.1 fL (ref 78.0–100.0)
PLATELETS: 474 10*3/uL — AB (ref 150–400)
RBC: 3.2 MIL/uL — AB (ref 4.22–5.81)
RDW: 13.9 % (ref 11.5–15.5)
WBC: 11.8 10*3/uL — AB (ref 4.0–10.5)

## 2016-09-29 LAB — BASIC METABOLIC PANEL
Anion gap: 13 (ref 5–15)
BUN: 41 mg/dL — ABNORMAL HIGH (ref 6–20)
CO2: 26 mmol/L (ref 22–32)
CREATININE: 1.91 mg/dL — AB (ref 0.61–1.24)
Calcium: 9.3 mg/dL (ref 8.9–10.3)
Chloride: 97 mmol/L — ABNORMAL LOW (ref 101–111)
GFR calc Af Amer: 42 mL/min — ABNORMAL LOW (ref >60)
GFR, EST NON AFRICAN AMERICAN: 37 mL/min — AB (ref >60)
Glucose, Bld: 106 mg/dL — ABNORMAL HIGH (ref 65–99)
Potassium: 3.8 mmol/L (ref 3.5–5.1)
SODIUM: 136 mmol/L (ref 135–145)

## 2016-09-29 MED ORDER — HYDRALAZINE HCL 25 MG PO TABS: 75.0000 mg | ORAL_TABLET | Freq: Three times a day (TID) | ORAL | 1 refills | Status: AC

## 2016-09-29 MED ORDER — HYDRALAZINE HCL 50 MG PO TABS
75.0000 mg | ORAL_TABLET | Freq: Three times a day (TID) | ORAL | Status: DC
  Administered 2016-09-29 – 2016-09-30 (×3): 75 mg via ORAL
  Filled 2016-09-29 (×3): qty 1

## 2016-09-29 MED ORDER — OXYCODONE-ACETAMINOPHEN 7.5-325 MG PO TABS: 1.0000 | ORAL_TABLET | ORAL | 0 refills | Status: AC | PRN

## 2016-09-29 MED ORDER — METHOCARBAMOL 500 MG PO TABS: 500.0000 mg | ORAL_TABLET | Freq: Four times a day (QID) | ORAL | 0 refills | Status: AC | PRN

## 2016-09-29 MED ORDER — PREDNISONE 10 MG PO TABS: ORAL_TABLET | ORAL | 0 refills | Status: AC

## 2016-09-29 MED ORDER — PREDNISONE 20 MG PO TABS
30.0000 mg | ORAL_TABLET | Freq: Every day | ORAL | Status: DC
  Administered 2016-09-30: 07:00:00 30 mg via ORAL
  Filled 2016-09-29: qty 1

## 2016-09-29 MED ORDER — GABAPENTIN 300 MG PO CAPS: 300.0000 mg | ORAL_CAPSULE | Freq: Three times a day (TID) | ORAL | 2 refills | Status: AC

## 2016-09-29 NOTE — Progress Notes (Signed)
[place] PHYSICAL MEDICINE & REHABILITATION     PROGRESS NOTE  Subjective/Complaints:  Pt seen working with PT this AM.  He slept well overnight. He has some stiffness this AM, but is otherwise okay.  He is looking forward to going home tomorrow.  ROS: Denies CP, SOB, N/V/D.  Objective: Vital Signs: Blood pressure (!) 145/76, pulse 89, temperature 98.2 F (36.8 C), temperature source Oral, resp. rate 19, height 5\' 2"  (1.575 m), SpO2 97 %. No results found.  Recent Labs  09/29/16 0501  WBC 11.8*  HGB 9.3*  HCT 28.5*  PLT 474*    Recent Labs  09/29/16 0501  NA 136  K 3.8  CL 97*  GLUCOSE 106*  BUN 41*  CREATININE 1.91*  CALCIUM 9.3   CBG (last 3)  No results for input(s): GLUCAP in the last 72 hours.  Wt Readings from Last 3 Encounters:  09/19/16 74 kg (163 lb 2.3 oz)  09/11/16 75.2 kg (165 lb 11.2 oz)  07/28/16 77.3 kg (170 lb 8 oz)    Physical Exam:  BP (!) 145/76 (BP Location: Left Arm)   Pulse 89   Temp 98.2 F (36.8 C) (Oral)   Resp 19   Ht 5\' 2"  (1.575 m)   SpO2 97%  Constitutional: He appears well-developed and well-nourished. No distress.  HENT: Normocephalic and atraumatic.  Eyes: EOMI. No discharge. Cardiovascular: RRR.  No JVD. Respiratory: Effort normal. No respiratory distress.  GI: BS+. He exhibits no distension.  Musculoskeletal: Mild Edema and tenderness in bilateral knee.  Neurological: He is alert and oriented.  Motor:  B/l UE 5/5 prox to distal.  B/l LE: 4/5 HF, 4+/5 KE and 4+/5 ADF/PF (improving).  Skin: No rash noted. No erythema.  Surgical incision c/d/i Psychiatric: He has a normal mood and affect. His behavior is normal. Judgment and thought content normal.    Assessment/Plan: 1. Functional deficits secondary to lumbar scoliosis and radiculopathy s/p L2-S1 decompression with T10 pelvis fusion which require 3+ hours per day of interdisciplinary therapy in a comprehensive inpatient rehab setting. Physiatrist is providing close  team supervision and 24 hour management of active medical problems listed below. Physiatrist and rehab team continue to assess barriers to discharge/monitor patient progress toward functional and medical goals.  Function:  Bathing Bathing position   Position: Wheelchair/chair at sink  Bathing parts Body parts bathed by patient: Right arm, Left arm, Chest, Abdomen, Front perineal area, Buttocks, Right upper leg, Left upper leg Body parts bathed by helper: Right lower leg, Left lower leg, Back  Bathing assist Assist Level:  (Mod A)      Upper Body Dressing/Undressing Upper body dressing   What is the patient wearing?: Pull over shirt/dress, Orthosis     Pull over shirt/dress - Perfomed by patient: Thread/unthread right sleeve, Thread/unthread left sleeve, Put head through opening, Pull shirt over trunk       Orthosis activity level: Performed by helper  Upper body assist Assist Level: Supervision or verbal cues      Lower Body Dressing/Undressing Lower body dressing   What is the patient wearing?: Pants, Non-skid slipper socks     Pants- Performed by patient: Pull pants up/down Pants- Performed by helper: Thread/unthread right pants leg, Thread/unthread left pants leg Non-skid slipper socks- Performed by patient: Don/doff right sock, Don/doff left sock Non-skid slipper socks- Performed by helper: Don/doff right sock, Don/doff left sock                  Lower  body assist Assist for lower body dressing:  (Max A)      Toileting Toileting Toileting activity did not occur: No continent bowel/bladder event Toileting steps completed by patient: Adjust clothing prior to toileting, Performs perineal hygiene, Adjust clothing after toileting Toileting steps completed by helper: Adjust clothing prior to toileting, Performs perineal hygiene, Adjust clothing after toileting Toileting Assistive Devices: Grab bar or rail  Toileting assist Assist level: Supervision or verbal cues    Transfers Chair/bed transfer   Chair/bed transfer method: Ambulatory Chair/bed transfer assist level: No Help, no cues, assistive device, takes more than a reasonable amount of time Chair/bed transfer assistive device: Armrests, Patent attorneyWalker     Locomotion Ambulation     Max distance: 200 ft Assist level: No help, No cues, assistive device, takes more than a reasonable amount of time   Wheelchair   Type: Manual Max wheelchair distance: 100 ft Assist Level: Supervision or verbal cues  Cognition Comprehension Comprehension assist level: Follows complex conversation/direction with no assist  Expression Expression assist level: Expresses complex ideas: With no assist  Social Interaction Social Interaction assist level: Interacts appropriately with others - No medications needed.  Problem Solving Problem solving assist level: Solves complex problems: With extra time  Memory Memory assist level: Complete Independence: No helper    Medical Problem List and Plan: 1.  Functional and mobility deficits secondary to lumbar scoliosis and radiculopathy s/p L2-S1 decompression with T10 pelvis fusion  Cont CIR, plan for d/c tomorrow  Will see patient for transitional care management in 1-2 weeks 2.  DVT Prophylaxis/Anticoagulation: Pharmaceutical: SCDs.   Lovenox to start ~3/24 per NS, will follow up on recs given discharge date.  Dopplers neg for DVT 3. Pain Management:  Managed by hydrocodone prn              pain appears well controlled at present in respect to back.  4. Mood: LCSW to follow for evaluation and support.  5. Neuropsych: This patient is capable of making decisions on his own behalf. 6. Skin/Wound Care: Monitor incision for healing. Routine pressure relief measures. Maintain adequate nutritional and hydration status.   Drain d/ced per NS 7. Fluids/Electrolytes/Nutrition: Encourage fluid intake.  8. Gout flare with severe pain:   Pt with rebound flare, after prednisone d/ced 3/18.    Prednisone 40 started 3/20, will start 14 day taper - 30mg  x3 days, 20x3, 10x3, 5x2. 9. Low grade fevers?FUO:  Resolved 10. ABLA: Improved post transfusion. Monitor serially.   Hb 9.3 on 3/23  Cont to monitor 11. Tachycardia: Multifactorial due to pain, anemia and deconditioning.   Resolved 12. Acute on chronic renal failure: Resolving. Avoid nephrotoxic medication and hypotension.  Encourage fluid intake.  Cr 1.91 on 3/23 (? Baseline ~1.6)  Cont to monitor  Encourage fluids  Will need follow up as outpt 13. HTN  Hydralazine 25 TID started 3/19 (Home 100), increased to 50 on 3/20, increased to 75 on 3/23 14. Hypoalbuminemia  Supplement initiated 3/19  LOS (Days) 7 A FACE TO FACE EVALUATION WAS PERFORMED  Ankit Karis Jubanil Patel 09/29/2016 8:37 AM

## 2016-09-29 NOTE — Progress Notes (Signed)
Social Work  Discharge Note  The overall goal for the admission was met for:   Discharge location: Yes-HOME WITH [name] WHO CAN ASSIST HIM-24 HR SUPERVISION  Length of Stay: Yes-8 DAYS  Discharge activity level: Yes-MOD/I LEVEL  Home/community participation: Yes  Services provided included: MD, RD, PT, OT, RN, CM, TR, Pharmacy and SW  Financial Services: Private Insurance: [place]  Follow-up services arranged: Home Health: [facility], DME: [place] & 3 IN 1 and Patient/Family has no preference for HH/DME agencies added tub bench  Comments (or additional information):[name] HAS BEEN HERE DAILY AND PARTICIPATED IN THERAPIES WITH PT. PT DID WELL AND REACHED MOD/I LEVEL READY TO [place].  Patient/Family verbalized understanding of follow-up arrangements: Yes  Individual responsible for coordination of the follow-up plan: SELF & [name]-GIRLFRIEND/ROOMMATE  Confirmed correct DME delivered: Elease Hashimoto 09/29/2016    Elease Hashimoto

## 2016-09-29 NOTE — Progress Notes (Signed)
Physical Therapy Discharge Summary  Patient Details  Name: Donald Romero MRN: [id number] Date of Birth: [birth year]  Today's Date: 09/29/2016 PT Individual Time: [phone] and [id number] PT Individual Time Calculation (min): 71 min and 53 min  Patient has met 7 of 7 long term goals due to improved activity tolerance, improved balance, improved postural control, increased strength, increased range of motion, decreased pain, ability to compensate for deficits and functional use of  right lower extremity and left lower extremity.  Patient to discharge at an ambulatory level Modified Independent household, supervision community. Patient's care partner is independent to provide the necessary physical assistance at discharge.  Reasons goals not met: NA  Recommendation:  Patient will benefit from ongoing skilled PT services in home health setting to continue to advance safe functional mobility, address ongoing impairments in strength, standing balance, coordination, and minimize fall risk.  Equipment: RW  Reasons for discharge: treatment goals met and discharge from hospital  Patient/family agrees with progress made and goals achieved: Yes  Skilled Therapeutic Intervention Treatment 1: Patient in wheelchair, Derl Barrow present for session. Patient performed transfers using RW, ambulation using RW in controlled, home, and community environments with maximum distance of 650 ft at a time including over uneven mulch surface x 10 ft, simulated car transfer using RW, bed mobility on flat surface, negotiated up/down 12 (6") stairs using 2 rails with reciprocal pattern, and negotiated up/down curb x 2 using RW with mod I. Seated EOM patient Practiced stair training using 1 rail only for community reintegration with verbal cues for step-to pattern for safety with supervision. Patient ambulated over thresholds with verbal cues for safe negotiation using RW and not placing RW too far away from body to maintain back  precautions. Performed Dynavision standing without UE support, 2 trials x 3 min each with scores of 177 both times and average reaction time [facility]. Patient self-corrected x 2 when twisting to reach across to L side of board with RUE. Reassessed 6 Minute Walk Test using RW =  825 ft and 1 seated rest break, improved from distance of 490 ft with 1 seated rest break on 09/27/16. Patient made mod I in room and left with all needs in reach and [name] present.   Treatment 2: Patient in wheelchair ready for therapy. Gait training throughout rehab unit with RW mod I. Provided patient with walker bag for personal RW to transport items safely. Performed NuStep using BUE/BLE at level 4 x 10 min for strengthening and endurance with rest breaks as needed. Gait training without AD 2 x 90 ft with min A, patient with increased lateral lean to R. Reviewed OTAGO Level C HEP for strengthening, 2 sets of 15 reps each LE per exercise: LAQ, standing knee flexion, standing heel raises, standing toe raises, standing hip abduction. Sit <> stand with UE support x 15 for BLE strengthening. See FTSS without UE support from arm chair score below. Patient ambulated back to room using RW and left sitting in wheelchair with needs in reach and [name] present. Patient and girlfriend with no further questions/concerns regarding discharge home planned tomorrow.   Five times Sit to Stand Test (FTSS) Method: Use a straight back chair with a solid seat that is 16-18" high. Ask participant to sit on the chair with arms folded across their chest.   Instructions: "Stand up and sit down as quickly as possible 5 times, keeping your arms folded across your chest."   Measurement: Stop timing when the participant stands the 5th time.  TIME: ___27___ (in seconds)   Times > 13.6 seconds is associated with increased disability and morbidity (Guralnik, 2000) Times > 15 seconds is predictive of recurrent falls in healthy individuals aged 7 and older  (Buatois, et al., 2008) Normal performance values in community dwelling individuals aged 64 and older (Bohannon, 2006): o 60-69 years: 11.4 seconds o 70-79 years: 12.6 seconds o 80-89 years: 14.8 seconds  MCID: ? 2.3 seconds for Vestibular Disorders Mariah Milling, 2006)  PT Discharge Precautions/Restrictions Precautions Precautions: Fall;Back Precaution Comments: gout pain Required Braces or Orthoses: Spinal Brace Spinal Brace: Lumbar corset;Applied in sitting position Restrictions Weight Bearing Restrictions: No Pain  Unrated stiffness in low back, improved with increased ambulation/activity Vision/Perception   No change from baseline  Cognition Overall Cognitive Status: Within Functional Limits for tasks assessed Sensation Sensation Light Touch: Appears Intact (BLE) Proprioception: Appears Intact (BLE) Motor  Motor Motor: Within Functional Limits  Mobility Bed Mobility Bed Mobility: Rolling Right;Rolling Left;Supine to Sit;Sit to Supine Rolling Right: 6: Modified independent (Device/Increase time) Rolling Left: 6: Modified independent (Device/Increase time) Supine to Sit: 6: Modified independent (Device/Increase time) Sit to Supine: 6: Modified independent (Device/Increase time) Transfers Transfers: Yes Sit to Stand: 6: Modified independent (Device/Increase time) Stand to Sit: 6: Modified independent (Device/Increase time) Locomotion  Ambulation Ambulation: Yes Ambulation/Gait Assistance: 6: Modified independent (Device/Increase time) Ambulation Distance (Feet): 650 Feet Assistive device: Rolling walker Gait Gait: Yes Gait Pattern: Impaired Gait Pattern: Decreased trunk rotation;Step-through pattern Gait velocity: WFL Stairs / Additional Locomotion Stairs: Yes Stairs Assistance: 6: Modified independent (Device/Increase time) Stair Management Technique: Two rails;Alternating pattern;Forwards Number of Stairs: 12 Height of Stairs: 6 Ramp: 6: Modified independent  (Device) Curb: 6: Modified independent (Device/increase time) Wheelchair Mobility Wheelchair Mobility: No  Trunk/Postural Assessment  Cervical Assessment Cervical Assessment: Within Functional Limits Thoracic Assessment Thoracic Assessment: Exceptions to [facility] (back precautions) Lumbar Assessment Lumbar Assessment: Exceptions to [facility] (back precautions) Postural Control Protective Responses: delayed  Balance Balance Balance Assessed: Yes Dynamic Standing Balance Dynamic Standing - Balance Support: During functional activity Dynamic Standing - Level of Assistance: 6: Modified independent (Device/Increase time) Extremity Assessment  RLE Assessment RLE Assessment: Within Functional Limits LLE Assessment LLE Assessment: Within Functional Limits   See Function Navigator for Current Functional Status.  Nereida Schepp, Murray Hodgkins 09/29/2016, 9:23 AM

## 2016-09-29 NOTE — Progress Notes (Signed)
Occupational Therapy Discharge Summary  Patient Details  Name: Donald Romero MRN: [id number] Date of Birth: 23-Sep-1955  Today's Date: 09/29/2016 OT Individual Time: 1000-1100 OT Individual Time Calculation (min): 60 min   OT treatment session focused on tub transfer training, home modifications, and dynamic standing balance. Pt ambulated to tub room and completed tub bench transfer mod I. Discussed home modifications for safe ADL participation. Standing balance on Biodex with focus on hip and ankle strategies on dynamic platform. Static/dynamic balance activity without UE support using wii bowling for activity demand. Provided pt with elastic shoelaces and pt demonstrated mod I to don/doff shoes. Pt left seated in wc with needs met.   Patient has met 10 of 10 long term goals due to improved activity tolerance, improved balance, postural control and ability to compensate for deficits.  Patient to discharge at overall Modified Independent level.  Patient's care partner is independent to provide the necessary physical assistance at discharge.    Reasons goals not met: n/a  Recommendation:  Patient will benefit from ongoing skilled OT services in home health setting to continue to advance functional skills in the area of BADL.  Equipment: tub transfer bench, 3-in-1 BSC, RW  Reasons for discharge: treatment goals met and discharge from hospital  Patient/family agrees with progress made and goals achieved: Yes  OT Discharge Precautions/Restrictions  Precautions Precautions: Fall;Back Precaution Booklet Issued: No Precaution Comments: gout pain Required Braces or Orthoses: Spinal Brace Spinal Brace: Lumbar corset;Applied in sitting position Restrictions Weight Bearing Restrictions: No Pain Pain Assessment Pain Score: 3  Pain Type: Acute pain Pain Location: Back Pain Descriptors / Indicators: Aching Pain Intervention(s): Repositioned ADL ADL Equipment Provided: Reacher, Sock aid,  Long-handled shoe horn, Long-handled sponge Eating: Independent Grooming: Modified independent Upper Body Bathing: Modified independent Where Assessed-Upper Body Bathing: Sitting at sink Lower Body Bathing: Modified independent Where Assessed-Lower Body Bathing: Sitting at sink Upper Body Dressing: Modified independent (Device) Where Assessed-Upper Body Dressing: Wheelchair Lower Body Dressing: Modified independent Where Assessed-Lower Body Dressing: Wheelchair Toileting: Modified independent Where Assessed-Toileting: Glass blower/designer: Diplomatic Services operational officer Method: Counselling psychologist: Energy manager: Modified independent Clinical cytogeneticist Method: Optometrist: Radio broadcast assistant ADL Comments: Please see functional navigator Cognition Overall Cognitive Status: Within Functional Limits for tasks assessed Arousal/Alertness: Awake/alert Orientation Level: Oriented X4 Attention: Sustained Sustained Attention: Appears intact Memory: Appears intact Awareness: Appears intact Problem Solving: Appears intact Safety/Judgment: Appears intact Sensation Sensation Light Touch: Appears Intact (BLE) Hot/Cold: Appears Intact Coordination Gross Motor Movements are Fluid and Coordinated: Yes Fine Motor Movements are Fluid and Coordinated: Yes Motor  Motor Motor: Within Functional Limits Mobility  Transfers Sit to Stand: 6: Modified independent (Device/Increase time)  Trunk/Postural Assessment  Cervical Assessment Cervical Assessment: Within Functional Limits Thoracic Assessment Thoracic Assessment: Exceptions to [facility] (back precautions) Lumbar Assessment Lumbar Assessment: Exceptions to [facility] (back precautions) Postural Control Postural Control: Deficits on evaluation Protective Responses: delayed  Balance Balance Balance Assessed: Yes Static Standing Balance Static Standing - Balance Support: During  functional activity Static Standing - Level of Assistance: 6: Modified independent (Device/Increase time) Dynamic Standing Balance Dynamic Standing - Balance Support: During functional activity Dynamic Standing - Level of Assistance: 6: Modified independent (Device/Increase time) Extremity/Trunk Assessment RUE Assessment RUE Assessment: Within Functional Limits LUE Assessment LUE Assessment: Within Functional Limits   See Function Navigator for Current Functional Status.  Daneen Schick Ziare Orrick 09/29/2016, 4:28 PM

## 2016-09-29 NOTE — Discharge Instructions (Signed)
Inpatient Rehab Discharge Instructions  Quincy CarnesJames R Crago Discharge date and time:    Activities/Precautions/ Functional Status: Activity: no lifting, driving, or strenuous exercise for till cleared by MD Diet: regular diet Wound Care: keep wound clean and dry   Functional status:  ___ No restrictions     ___ Walk up steps independently ___ 24/7 supervision/assistance   ___ Walk up steps with assistance ___ Intermittent supervision/assistance  ___ Bathe/dress independently ___ Walk with walker     ___ Bathe/dress with assistance ___ Walk Independently    ___ Shower independently ___ Walk with assistance    ___ Shower with assistance _X__ No alcohol     ___ Return to work/school ________  Special Instructions: 1. No bending, twisting or arching.  2. Wear brace when at edge of bed or ambulating.   COMMUNITY REFERRALS UPON DISCHARGE:    Home Health:   PT   Agency:[facility]   Phone:[phone]   Date of last service:09/30/2016  Medical Equipment/Items Ordered:YOUTH ROLLING WALKER, 3 IN 1 & TUB BENCH  Agency/Supplier:[facility]   [phone]    My questions have been answered and I understand these instructions. I will adhere to these goals and the provided educational materials after my discharge from the hospital.  Patient/Caregiver Signature _______________________________ Date __________  Clinician Signature _______________________________________ Date __________  Please bring this form and your medication list with you to all your follow-up doctor's appointments.

## 2016-09-30 DIAGNOSIS — M4106 Infantile idiopathic scoliosis, lumbar region: Secondary | ICD-10-CM

## 2016-09-30 NOTE — Progress Notes (Signed)
Patient's girlfriend observed and then completed dressing application to back incision. No questions. No drainage noted and margins attached. Patient given pain med prior to discharge. Escorted to car with all belongings by NT.

## 2016-09-30 NOTE — Progress Notes (Signed)
[facility] PHYSICAL MEDICINE & REHABILITATION     PROGRESS NOTE  Subjective/Complaints:  Up in w/c dressed and ready to leave!!  ROS: pt denies nausea, vomiting, diarrhea, cough, shortness of breath or chest pain  Objective: Vital Signs: Blood pressure (!) 143/72, pulse 91, temperature 98.8 F (37.1 C), temperature source Oral, resp. rate 18, height 5\' 2"  (1.575 m), SpO2 96 %. No results found.  Recent Labs  09/29/16 0501  WBC 11.8*  HGB 9.3*  HCT 28.5*  PLT 474*    Recent Labs  09/29/16 0501  NA 136  K 3.8  CL 97*  GLUCOSE 106*  BUN 41*  CREATININE 1.91*  CALCIUM 9.3   CBG (last 3)  No results for input(s): GLUCAP in the last 72 hours.  Wt Readings from Last 3 Encounters:  09/19/16 74 kg (163 lb 2.3 oz)  09/11/16 75.2 kg (165 lb 11.2 oz)  07/28/16 77.3 kg (170 lb 8 oz)    Physical Exam:  BP (!) 143/72 (BP Location: Left Arm)   Pulse 91   Temp 98.8 F (37.1 C) (Oral)   Resp 18   Ht 5\' 2"  (1.575 m)   SpO2 96%  Constitutional: He appears well-developed and well-nourished. No distress.  HENT: Normocephalic and atraumatic.  Eyes: EOMI. No discharge. Cardiovascular: RRR. Respiratory: normal effort.  GI: BS+. He exhibits no distension.  Musculoskeletal: Mild Edema and tenderness in bilateral knee.  Neurological: He is alert and oriented.  Motor:  B/l UE 5/5 prox to distal.  B/l LE: 4/5 HF, 4+/5 KE and 4+/5 ADF/PF (improving).  Skin: No rash noted. No erythema.  Surgical incision c/d/i Psychiatric: He has a normal mood and affect. His behavior is normal. Judgment and thought content normal.    Assessment/Plan: 1. Functional deficits secondary to lumbar scoliosis and radiculopathy s/p L2-S1 decompression with T10 pelvis fusion which require 3+ hours per day of interdisciplinary therapy in a comprehensive inpatient rehab setting. Physiatrist is providing close team supervision and 24 hour management of active medical problems listed below. Physiatrist and  rehab team continue to assess barriers to discharge/monitor patient progress toward functional and medical goals.  Function:  Bathing Bathing position   Position: Wheelchair/chair at sink  Bathing parts Body parts bathed by patient: Right arm, Left arm, Chest, Abdomen, Front perineal area, Right upper leg, Buttocks, Left upper leg, Right lower leg, Left lower leg Body parts bathed by helper: Right lower leg, Left lower leg, Back  Bathing assist Assist Level: Assistive device Assistive Device Comment: Long-handled sponge    Upper Body Dressing/Undressing Upper body dressing   What is the patient wearing?: Pull over shirt/dress     Pull over shirt/dress - Perfomed by patient: Thread/unthread right sleeve, Thread/unthread left sleeve, Put head through opening, Pull shirt over trunk       Orthosis activity level: Performed by patient  Upper body assist Assist Level: No help, No cues      Lower Body Dressing/Undressing Lower body dressing   What is the patient wearing?: Underwear, Pants, Socks, Shoes Underwear - Performed by patient: Thread/unthread right underwear leg, Thread/unthread left underwear leg, Pull underwear up/down   Pants- Performed by patient: Thread/unthread right pants leg, Thread/unthread left pants leg, Pull pants up/down Pants- Performed by helper: Thread/unthread right pants leg, Thread/unthread left pants leg Non-skid slipper socks- Performed by patient: Don/doff right sock, Don/doff left sock Non-skid slipper socks- Performed by helper: Don/doff right sock, Don/doff left sock Socks - Performed by patient: Don/doff left sock, Don/doff right  sock   Shoes - Performed by patient: Don/doff right shoe, Don/doff left shoe, Fasten right, Fasten left            Lower body assist Assist for lower body dressing: Assistive device Assistive Device Comment: reacher, sock-aid, elastic shoe laces    Toileting Toileting Toileting activity did not occur: No continent  bowel/bladder event Toileting steps completed by patient: Adjust clothing prior to toileting, Performs perineal hygiene, Adjust clothing after toileting Toileting steps completed by helper: Adjust clothing prior to toileting, Performs perineal hygiene, Adjust clothing after toileting Toileting Assistive Devices: Grab bar or rail  Toileting assist Assist level: More than reasonable time   Transfers Chair/bed transfer   Chair/bed transfer method: Ambulatory Chair/bed transfer assist level: No Help, no cues, assistive device, takes more than a reasonable amount of time Chair/bed transfer assistive device: Armrests, Patent attorney     Max distance: 200 ft Assist level: No help, No cues, assistive device, takes more than a reasonable amount of time   Wheelchair   Type: Manual Max wheelchair distance: 100 ft Assist Level: Supervision or verbal cues  Cognition Comprehension Comprehension assist level: Follows complex conversation/direction with no assist  Expression Expression assist level: Expresses complex ideas: With no assist  Social Interaction Social Interaction assist level: Interacts appropriately with others - No medications needed.  Problem Solving Problem solving assist level: Solves complex problems: With extra time  Memory Memory assist level: Complete Independence: No helper    Medical Problem List and Plan: 1.  Functional and mobility deficits secondary to lumbar scoliosis and radiculopathy s/p L2-S1 decompression with T10 pelvis fusion  Home today  Will see patient for transitional care management in 1-2 weeks 2.  DVT Prophylaxis/Anticoagulation: Pharmaceutical: SCDs.   Lovenox to start ~3/24 per NS .  Dopplers neg for DVT 3. Pain Management:  Managed by hydrocodone prn              pain appears well controlled at present in respect to back.  4. Mood: LCSW to follow for evaluation and support.  5. Neuropsych: This patient is capable of making  decisions on his own behalf. 6. Skin/Wound Care: Monitor incision for healing. Routine pressure relief measures. Maintain adequate nutritional and hydration status.   Drain d/ced per NS 7. Fluids/Electrolytes/Nutrition: Encourage fluid intake.  8. Gout flare with severe pain:   Pt with rebound flare, after prednisone d/ced 3/18.   Prednisone 40 started 3/20, will start 14 day taper - 30mg  x3 days, 20x3, 10x3, 5x2. 9. Low grade fevers?FUO:  Resolved 10. ABLA: Improved post transfusion. Monitor serially.   Hb 9.3 on 3/23  Cont to monitor 11. Tachycardia: Multifactorial due to pain, anemia and deconditioning.   Resolved 12. Acute on chronic renal failure: Resolving. Avoid nephrotoxic medication and hypotension.  Encourage fluid intake.  Cr 1.91 on 3/23 (? Baseline ~1.6)  Cont to monitor  Encourage fluids  Will need follow up as outpt 13. HTN  Hydralazine 25 TID started 3/19 (Home 100), increased to 50 on 3/20, increased to 75 on 3/23 14. Hypoalbuminemia  Supplement initiated 3/19  LOS (Days) 8 A FACE TO FACE EVALUATION WAS PERFORMED  SWARTZ,ZACHARY T 09/30/2016 7:47 AM

## 2016-10-01 NOTE — Op Note (Signed)
09/18/2016  10:00 AM  PATIENT:  Donald Romero  61 y.o. male  PRE-OPERATIVE DIAGNOSIS:  Idiopathic scoliosis in an adult patient; lumbosacral spondylosis with radiculopathy; lumbar stenosis  POST-OPERATIVE DIAGNOSIS:  Same  PROCEDURE:  Bilateral pedicle screw fixation right T10-pelvis, left L2-pelvis, with S2 alar-iliac screws for pelvic fixation; exploration of fusion T10-L2; laminectomy for decompression L2-S1; use of BMP  SURGEON:  Hulan Saas, MD  ASSISTANTS: Julio Sicks, MD; Cindra Presume, PA-C  ANESTHESIA:   General  DRAINS: Medium hemovac  SPECIMEN:  None  INDICATION FOR PROCEDURE: Donald Romero is a 61 year old man who underwent T2-L2 instrumentation and fusion utilizing a Harrington rod at 61 years of age.  He presents with lumbar radiculopathy due to severe stenosis from L2-S1.  He has a substantial compensatory curve from L2 to the sacrum but because he is relatively well balanced with his head over is pelvis it was determined the best course of action was decompression and in situ fusion without deformity correction.  Patient understood the risks, benefits, and alternatives and potential outcomes and wished to proceed.  PROCEDURE DETAILS: After smooth induction of general endotracheal anesthesia the patient was turned prone on an open [place] table.  The skin of the mid and low back was clipped of hair and wiped down with alcohol.  The course of the planned incision was infiltrated with a mixture of lidocaine and marcaine with epinephrine.  The patient was prepped and draped in the usual sterile fashion.  The skin was opened sharply and subperiosteal dissection was performed over the spinous processes, laminae and transverse processes from L2 to the sacrum and cranially over the fusion mass from L2 to T10.  I inspected the fusion mass from T10 to L2 and found it to be solid at all levels without any abnormal motion.    Bilateral PSIS pins were placed through stab  incisions.  The Mazor X surgical robot was connected to the PSIS pins and registered using fluoroscopy.  Pedicles were cannulated from L2-S1 bilaterally using a drill guide and high speed burr.  A reduction tube was passed down each cannulated pedicle and a K wire was placed.  The drill guide was used for the S2AI screw trajectories and a longer drill was used to cross the SI joints.  A cannulated tap was used over each K wire and then the K wires were removed.  Screw trajectories were palpated with a ball tip probe and found to be competent without any palpable breaches.  Screws were then placed at each of these levels.  Re-registration was performed at the thoracolumbar junction and this process was repeated to place screws from T10-L1 on the right.  Attention was then turned to decompression.  Using the high speed burr the laminae were thinned from L2-S1.  Curettes were used to separate the ventral surface of the laminae from the the underlying soft tissues and dura.  The laminae were then widely resected using Kerrison and Leksell rongeurs.  Hypertrophied ligament was then elevated and separated from dura.  It was resected widely at all levels.  Lateral recess decompression was carried out with smaller rongeurs after separating the dura and nerve roots from the hypertrophied ligament.  I then bent rods to fit the screws on each side.  The rods were placed and secured with minimal reduction required.  They were secured with set screws and tightened with a torque device.  I then irrigated vigorously throughout.  I decorticated the caudal end of  the fusion mass to L1, the transverse processes of L2-L5, and the sacral ala.  I then placed thin strips of BMP sponges over the decorticated surfaces and then placed a large volume of corticocancellous chips mixed with autograft over the decorticated surfaces.  A medium hemovac drain was placed in the subfascial space.  Vancomycin powder was placed in the depths of  the wound.  There was excellent hemostasis.  The wound was closed in routine anatomic layers with running Stratafix suture.  The skin was closed with dermabond.  The PSIS pins were removed and the skin closed with interrupted vicryl sutures and dermabond.  PATIENT DISPOSITION:  ICU - intubated and stable   Delay start of Pharmacological VTE agent (>24hrs) due to surgical blood loss or risk of bleeding:  Yes

## 2016-10-11 ENCOUNTER — Telehealth: Payer: Self-pay | Admitting: Physical Medicine & Rehabilitation

## 2016-10-11 NOTE — Telephone Encounter (Signed)
Patient is requesting a refill on his pain medication (oxycodone).  Please call patient.

## 2016-10-19 ENCOUNTER — Encounter: Payer: Self-pay | Admitting: Physical Medicine & Rehabilitation

## 2016-10-19 ENCOUNTER — Encounter: Payer: 59 | Attending: Physical Medicine & Rehabilitation | Admitting: Physical Medicine & Rehabilitation

## 2016-10-19 VITALS — BP 163/84 | HR 100 | Resp 14

## 2016-10-19 DIAGNOSIS — M4316 Spondylolisthesis, lumbar region: Secondary | ICD-10-CM | POA: Insufficient documentation

## 2016-10-19 DIAGNOSIS — I129 Hypertensive chronic kidney disease with stage 1 through stage 4 chronic kidney disease, or unspecified chronic kidney disease: Secondary | ICD-10-CM | POA: Insufficient documentation

## 2016-10-19 DIAGNOSIS — M4106 Infantile idiopathic scoliosis, lumbar region: Secondary | ICD-10-CM

## 2016-10-19 DIAGNOSIS — N183 Chronic kidney disease, stage 3 (moderate): Secondary | ICD-10-CM | POA: Diagnosis not present

## 2016-10-19 DIAGNOSIS — G8918 Other acute postprocedural pain: Secondary | ICD-10-CM

## 2016-10-19 DIAGNOSIS — M4126 Other idiopathic scoliosis, lumbar region: Secondary | ICD-10-CM | POA: Diagnosis not present

## 2016-10-19 DIAGNOSIS — M109 Gout, unspecified: Secondary | ICD-10-CM | POA: Insufficient documentation

## 2016-10-19 DIAGNOSIS — M10062 Idiopathic gout, left knee: Secondary | ICD-10-CM | POA: Diagnosis not present

## 2016-10-19 DIAGNOSIS — Z79899 Other long term (current) drug therapy: Secondary | ICD-10-CM

## 2016-10-19 DIAGNOSIS — F17210 Nicotine dependence, cigarettes, uncomplicated: Secondary | ICD-10-CM | POA: Diagnosis not present

## 2016-10-19 DIAGNOSIS — Z808 Family history of malignant neoplasm of other organs or systems: Secondary | ICD-10-CM | POA: Diagnosis not present

## 2016-10-19 DIAGNOSIS — Z5181 Encounter for therapeutic drug level monitoring: Secondary | ICD-10-CM

## 2016-10-19 DIAGNOSIS — Z981 Arthrodesis status: Secondary | ICD-10-CM | POA: Insufficient documentation

## 2016-10-19 DIAGNOSIS — R269 Unspecified abnormalities of gait and mobility: Secondary | ICD-10-CM | POA: Diagnosis not present

## 2016-10-19 NOTE — Patient Instructions (Signed)
Please follow up with PCP regarding blood pressure  Please schedule appointment with Neurosurg, Dr. Bevely Palmer  Please remember to keep food diary

## 2016-10-19 NOTE — Progress Notes (Signed)
Subjective:    Patient ID: Donald Romero, male    DOB: September 11, 1955, 61 y.o.   MRN: [id number]  HPI 61 y.o. male with history of HTN, CKD stage III, idiopathic scoliosis treated with T2- L2 fusion at age 8 presents for hospital follow up after receiving CIR for lumbar scoliosis and radiculopathy s/p L2-S1 decompression with T10 pelvis fusion.    Admit date: 09/22/2016 Discharge date: 09/30/2016  At discharge, he was instructed to follow up with Dr. Bevely Palmer, which he has not done yet.  He has been compliant with driving and lifting.  He notes he has been twisting and arching from time to time.  He saw his PCP and lab work was drawn.  His gout flared in his left knee and right ankle because of something he ate, but he states it is improving. His BP is elevated, medications recently adjusted yesterday by PCP.   DME: Shower chair, bedside commode Therapies: 2/week Mobility: Walker at all times  Pain Inventory Average Pain 7 Pain Right Now 2 My pain is constant  In the last 24 hours, has pain interfered with the following? General activity 0 Relation with others 0 Enjoyment of life 0 What TIME of day is your pain at its worst? morning, daytime, evening, night Sleep (in general) Poor  Pain is worse with: walking, bending, sitting, inactivity, standing and some activites Pain improves with: medication Relief from Meds: 7  Mobility walk without assistance ability to climb steps?  yes do you drive?  no  Function I need assistance with the following:  dressing, bathing, meal prep, household duties and shopping Do you have any goals in this area?  yes  Neuro/Psych trouble walking  Prior Studies hospital f/u  Physicians involved in your care hospital f/u   Family History  Problem Relation Age of Onset  . Hypertension Mother   . Lung disease Father     asbestosis  . Thyroid cancer Brother    Social History   Social History  . Marital status: Divorced    Spouse name: N/A    . Number of children: N/A  . Years of education: N/A   Social History Main Topics  . Smoking status: Current Every Day Smoker    Packs/day: 0.50  . Smokeless tobacco: Never Used  . Alcohol use No  . Drug use: No  . Sexual activity: Not Asked   Other Topics Concern  . None   Social History Narrative  . None   Past Surgical History:  Procedure Laterality Date  . APPLICATION OF ROBOTIC ASSISTANCE FOR SPINAL PROCEDURE N/A 09/18/2016   Procedure: APPLICATION OF ROBOTIC ASSISTANCE FOR SPINAL PROCEDURE;  Surgeon: Loura Halt Ditty, MD;  Location: [facility] OR;  Service: Neurosurgery;  Laterality: N/A;  . BACK SURGERY    . COLONOSCOPY    . POSTERIOR LUMBAR FUSION 4 LEVEL N/A 09/18/2016   Procedure: Lumbar two-Sacrum one  Laminectomy for decompression with Thoracic ten  to pelvis fixation/fusion with Mazor robot;  Surgeon: Loura Halt Ditty, MD;  Location: [facility] OR;  Service: Neurosurgery;  Laterality: N/A;  . TONSILLECTOMY    . WRIST SURGERY Right    Past Medical History:  Diagnosis Date  . Arthritis   . Gout   . Hypertension    BP (!) 163/84   Pulse 100   Resp 14   SpO2 92%   Opioid Risk Score:   Fall Risk Score:  `1  Depression screen PHQ 2/9  Depression screen [facility] 2/9 10/19/2016  Decreased Interest 0  Down, Depressed, Hopeless 0  PHQ - 2 Score 0  Altered sleeping 1  Tired, decreased energy 1  Change in appetite 0  Feeling bad or failure about yourself  0  Trouble concentrating 0  Moving slowly or fidgety/restless 0  Suicidal thoughts 0  PHQ-9 Score 2  Difficult doing work/chores Somewhat difficult    Review of Systems  Constitutional: Negative.   HENT: Negative.   Eyes: Negative.   Respiratory: Negative.   Cardiovascular: Negative.   Gastrointestinal: Negative.   Endocrine: Negative.   Genitourinary: Negative.   Musculoskeletal: Positive for arthralgias, back pain, gait problem and joint swelling.  Skin: Negative.   Allergic/Immunologic: Negative.    Hematological: Negative.   Psychiatric/Behavioral: Negative.   All other systems reviewed and are negative.      Objective:   Physical Exam Constitutional: He appears well-developed and well-nourished. No distress.  HENT: Normocephalic and atraumatic.  Eyes: EOMI. No discharge. Cardiovascular: RRR. No JVD. Respiratory: normal effort. Clear GI: BS+. He exhibits no distension.  Musculoskeletal: No edema. Mild tenderness left knee. Neurological: He is alert and oriented.  Motor:  B/l UE 5/5 proximal to distal.  B/l LE: 5/5 proximal to distal   Skin: No rash noted. No erythema.  Psychiatric: He has a normal mood and affect. His behavior is normal. Judgment and thought content normal.     Assessment & Plan:  61 y.o. male with history of HTN, CKD stage III, idiopathic scoliosis treated with T2- L2 fusion at age 89 presents for hospital follow up after receiving CIR for lumbar scoliosis and radiculopathy s/p L2-S1 decompression with T10 pelvis fusion.    1.  Functional and mobility deficits secondary to lumbar scoliosis and radiculopathy s/p L2-S1 decompression with T10 pelvis fusion             Cont therapies  Follow up with Neurosurg (pt needs to make appointment)  Cont brace and precautions  2. Pain Management:    Wean hydrocodone, no plans for refill  3. Gout flare   Cont meds  Another flare, but improving  Pt to keep food diary  4. HTN  Cont meds  Pt to follow up with PCP for further adjustments, adjusted yesterday  5. Gait abnormality  Cont walker for safety  Cont therapies  Meds reviewed Referrals reviewed - Needs Neurosurg appointment All questions anwered

## 2016-12-21 ENCOUNTER — Encounter: Payer: 59 | Attending: Physical Medicine & Rehabilitation | Admitting: Physical Medicine & Rehabilitation

## 2016-12-21 DIAGNOSIS — R269 Unspecified abnormalities of gait and mobility: Secondary | ICD-10-CM | POA: Insufficient documentation

## 2016-12-21 DIAGNOSIS — N183 Chronic kidney disease, stage 3 (moderate): Secondary | ICD-10-CM | POA: Insufficient documentation

## 2016-12-21 DIAGNOSIS — M4316 Spondylolisthesis, lumbar region: Secondary | ICD-10-CM | POA: Insufficient documentation

## 2016-12-21 DIAGNOSIS — I129 Hypertensive chronic kidney disease with stage 1 through stage 4 chronic kidney disease, or unspecified chronic kidney disease: Secondary | ICD-10-CM | POA: Insufficient documentation

## 2016-12-21 DIAGNOSIS — M109 Gout, unspecified: Secondary | ICD-10-CM | POA: Insufficient documentation

## 2016-12-21 DIAGNOSIS — Z981 Arthrodesis status: Secondary | ICD-10-CM | POA: Insufficient documentation

## 2016-12-21 DIAGNOSIS — F17210 Nicotine dependence, cigarettes, uncomplicated: Secondary | ICD-10-CM | POA: Insufficient documentation

## 2016-12-21 DIAGNOSIS — M4126 Other idiopathic scoliosis, lumbar region: Secondary | ICD-10-CM | POA: Insufficient documentation

## 2016-12-21 DIAGNOSIS — Z808 Family history of malignant neoplasm of other organs or systems: Secondary | ICD-10-CM | POA: Insufficient documentation

## 2017-11-08 IMAGING — CT CT T SPINE W/O CM
3 of 5 series · 10 of 36 positions shown, 11 images · non-contrast
Comparison: 04/24/2016

CLINICAL DATA: Pre-surgical planning protocol prior to spinal
instrumentation.

EXAM:
CT THORACIC AND LUMBAR SPINE WITHOUT CONTRAST
TECHNIQUE: Multidetector CT imaging of the thoracic and lumbar spine was
performed without contrast. Multiplanar CT image reconstructions
were also generated.

[Series 4: l-spine adult 0.6 · axial · 0.39mm/px · z∈[+1286,+1487]mm · 2 of 727 slices shown, 3 images]
[im 224/727  soft-tissue]
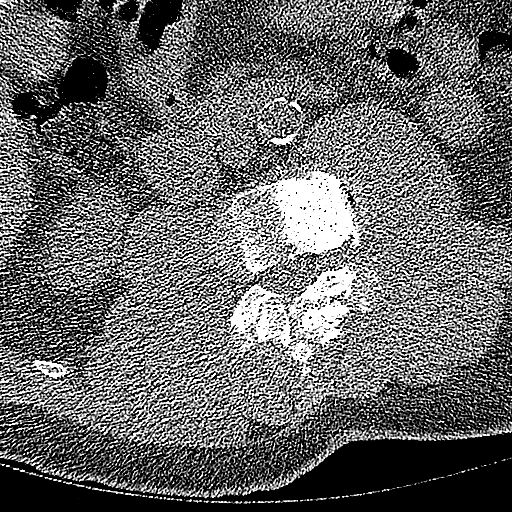
[im 224/727  bone]
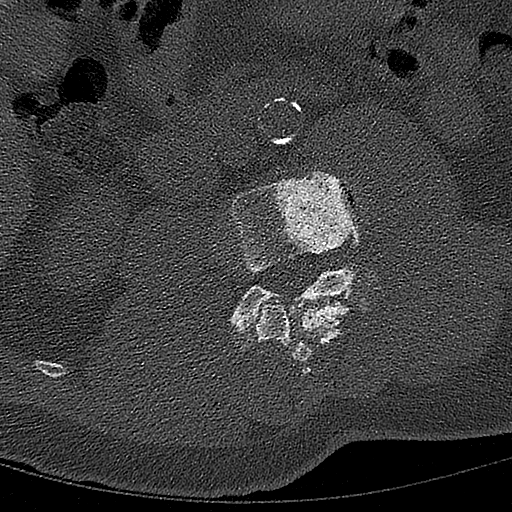
[im 559/727  bone]
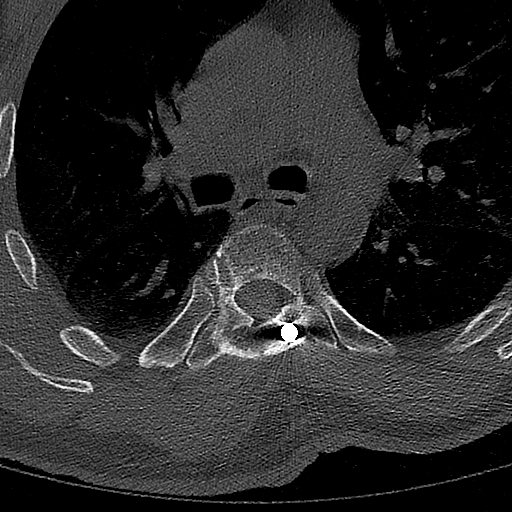

[Series 8: l-spine adult 2.0 · sagittal · 0.39mm/px · 6 of 101 slices shown (1 of 2)]
[im 45/101  bone]
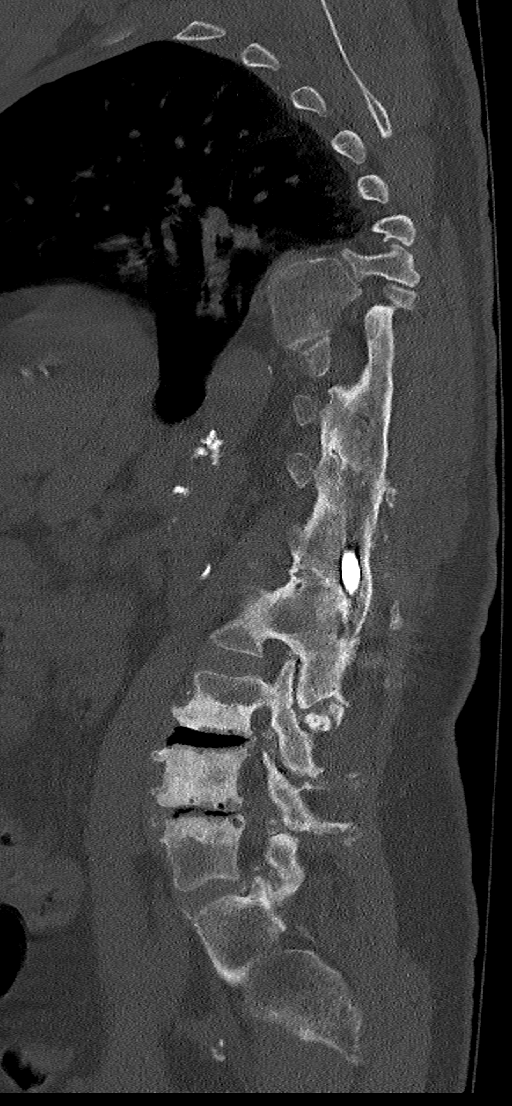
[im 54/101  bone]
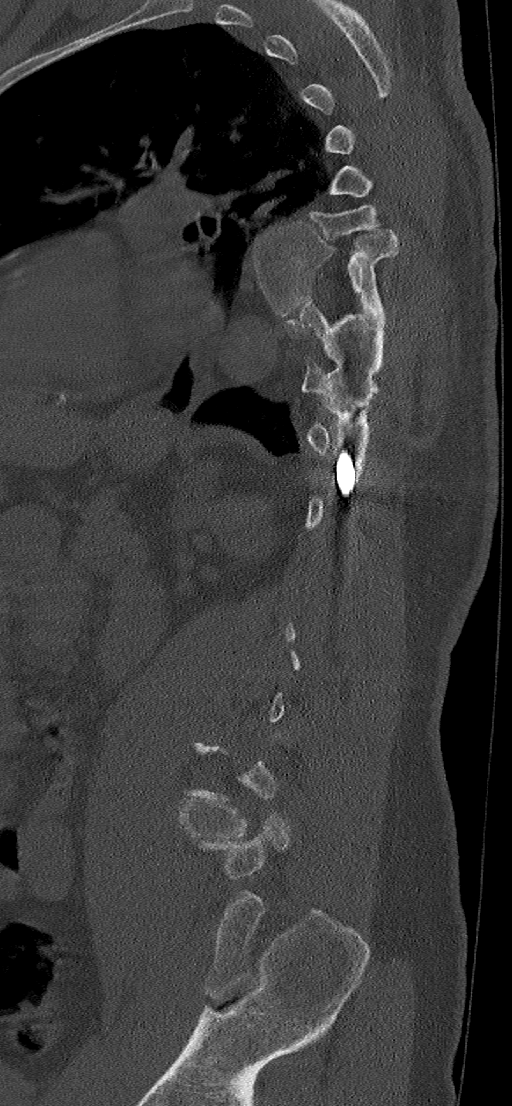
[im 63/101  bone]
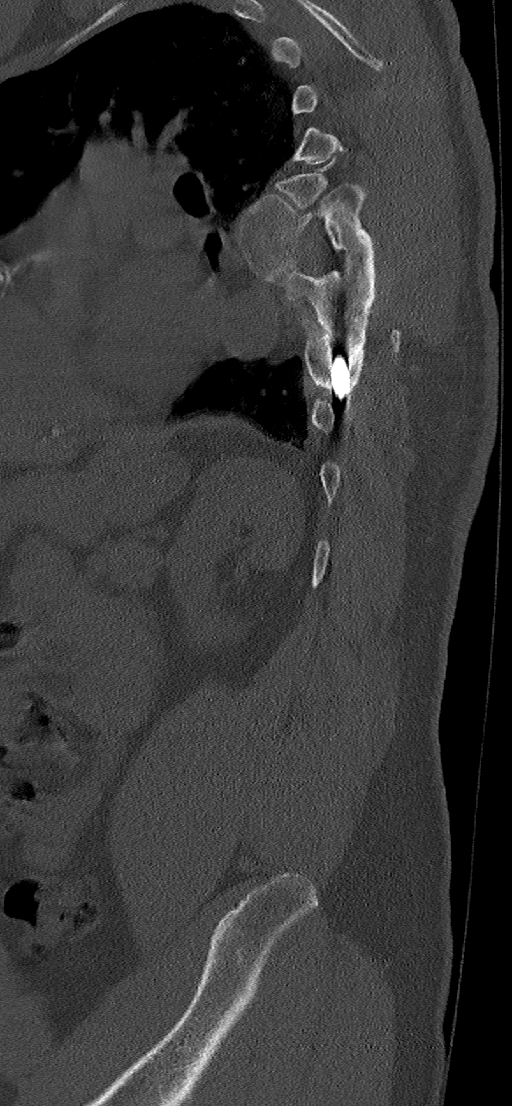
[im 73/101  bone]
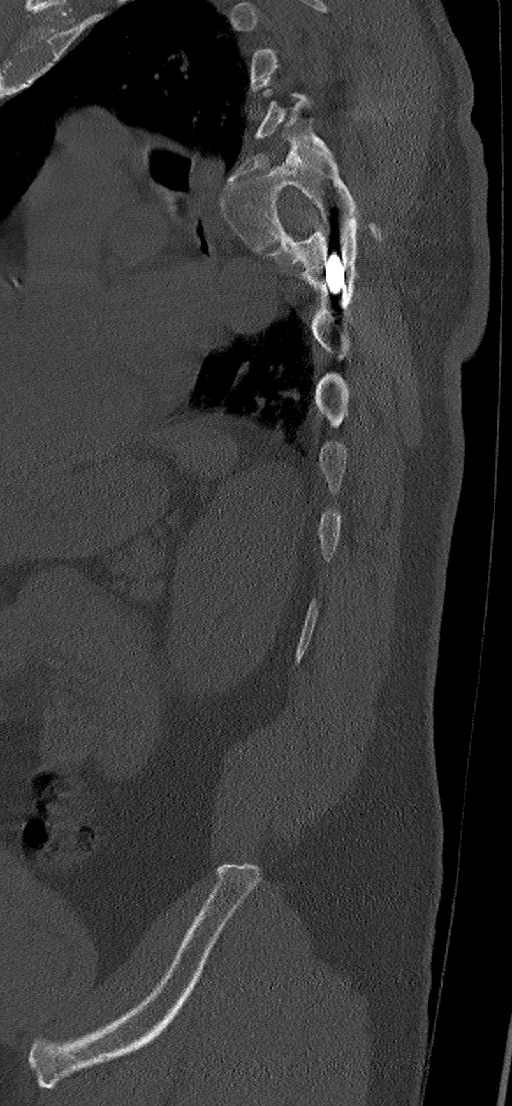
[im 77/101  soft-tissue]
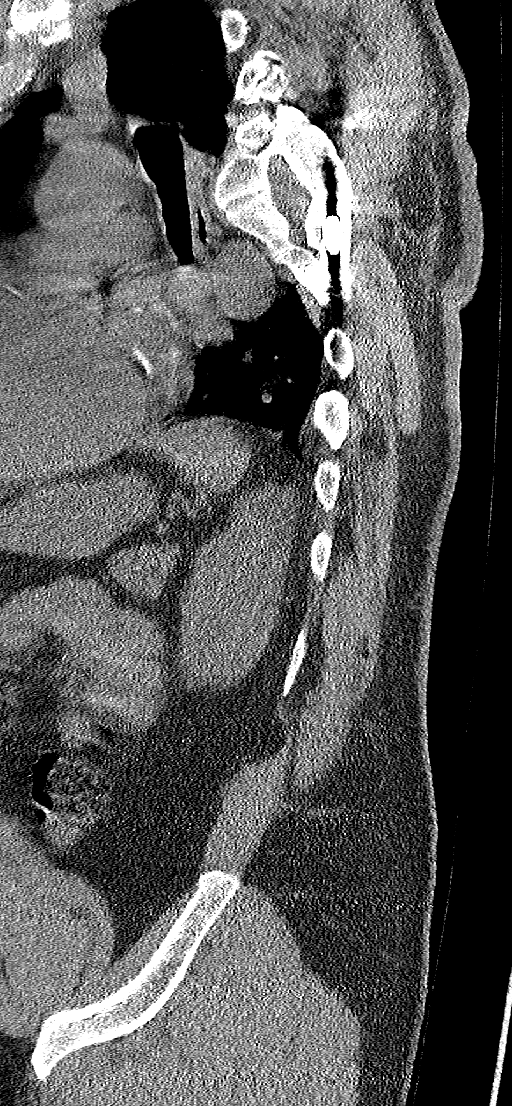
[im 82/101  bone]
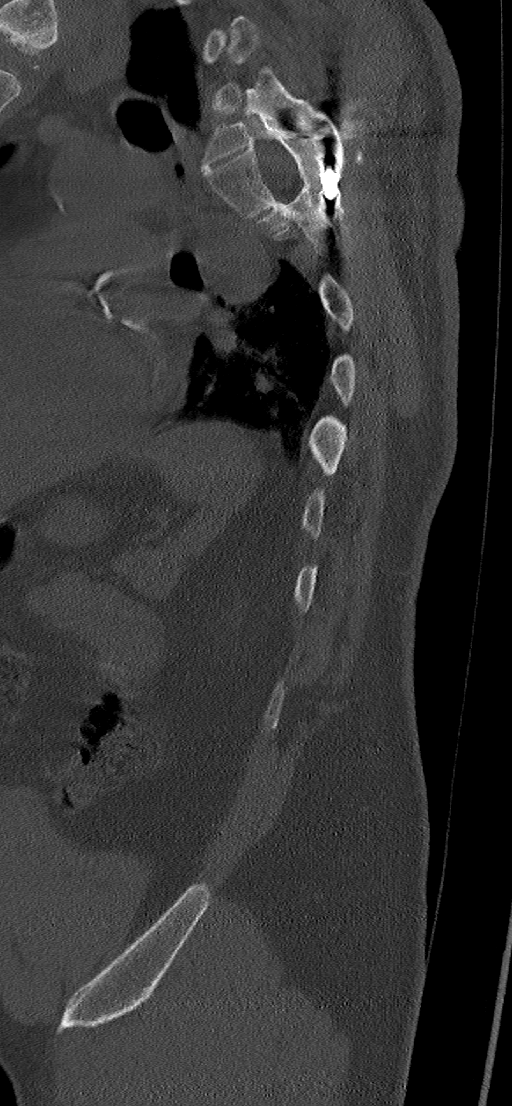

[Series 9: l-spine adult 2.0 · coronal · 0.69mm/px · 2 of 101 slices shown (2 of 2)]
[im 37/101  bone]
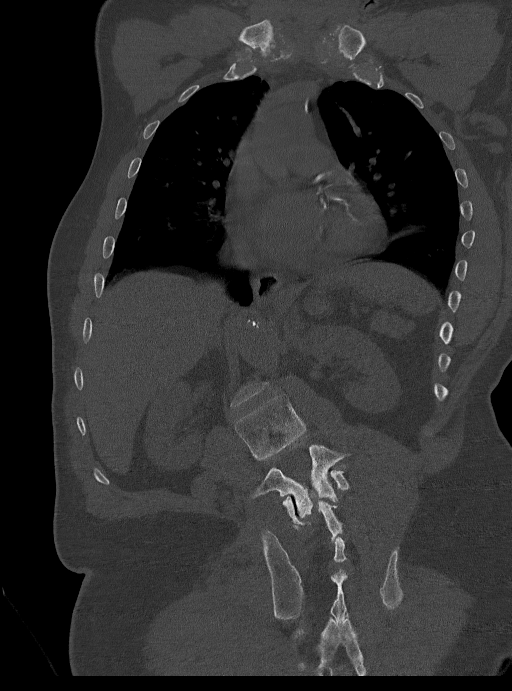
[im 73/101  bone]
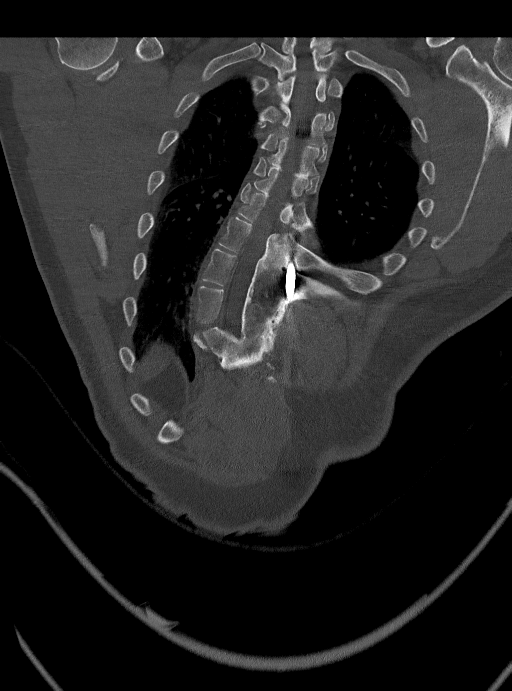

[10 of 36 positions shown; findings below may reference images not displayed]

FINDINGS: CT THORACIC SPINE FINDINGS

There are 12 rib bearing thoracic vertebrae. There is upper thoracic
curvature convex to the left and lower thoracic/thoracolumbar
curvature convex to the right. There has been previous spinal fusion
with a single posterior rod with laminar hooks at T4 and L2. There
is not fusion at T1-2 or T2-3 and there are mild posterior element
degenerative changes. There is solid posterior fusion beginning at
the T3 posterior elements with fusion being solid all the way
through L2. There are some ordinary costovertebral osteoarthritic
changes in the thoracic spine, not likely significant.

CT LUMBAR SPINE FINDINGS

Thoracolumbar fusion is solid extending down through the L2 level
with there is a laminar broke and posterior rod. At L2-3, there is
some facet osteoarthritis. No significant stenosis was demonstrated
at previous myelography. At L3-4 and L4-5, there is advanced disc
degeneration with vacuum phenomenon and sclerotic change of the L3,
L4 and L5 vertebral bodies. The L5-S1 disc does not show
degeneration but there is L5-S1 facet arthropathy. Stenosis is
present at the L3-4, L4-5 and L5-S1 levels as shown at previous
myelography.
IMPRESSION: Pre-surgical planning protocol.

Thoracolumbar curvature convex to the left in the upper thoracic
region, to the right in the thoracolumbar region and to the left in
the lower lumbar region.

Previous ride fusion with laminar hypoxic at T4 and L2. Solid fusion
from T3 through L2.

Mild facet osteoarthritis at the L2-3 level without stenosis.

Advanced disc degeneration at L3-4 and L4-5. Posterior element
hypertrophy. Spinal stenosis at L3-4, L4-5 and L5-S1 as shown at
previous myelography.

## 2017-11-08 IMAGING — RF DG THORACOLUMBAR SPINE 2V
1 series · 7 of 7 positions shown · non-contrast
Comparison: CT thoracic spine 09/18/2016

CLINICAL DATA: Thoracolumbar spinal fixation

EXAM:
DG C-ARM 61-120 MIN; THORACOLUMBAR SPINE - 2 VIEW

[Series 1: run · 7 of 7 slices shown]
[im 1/7]
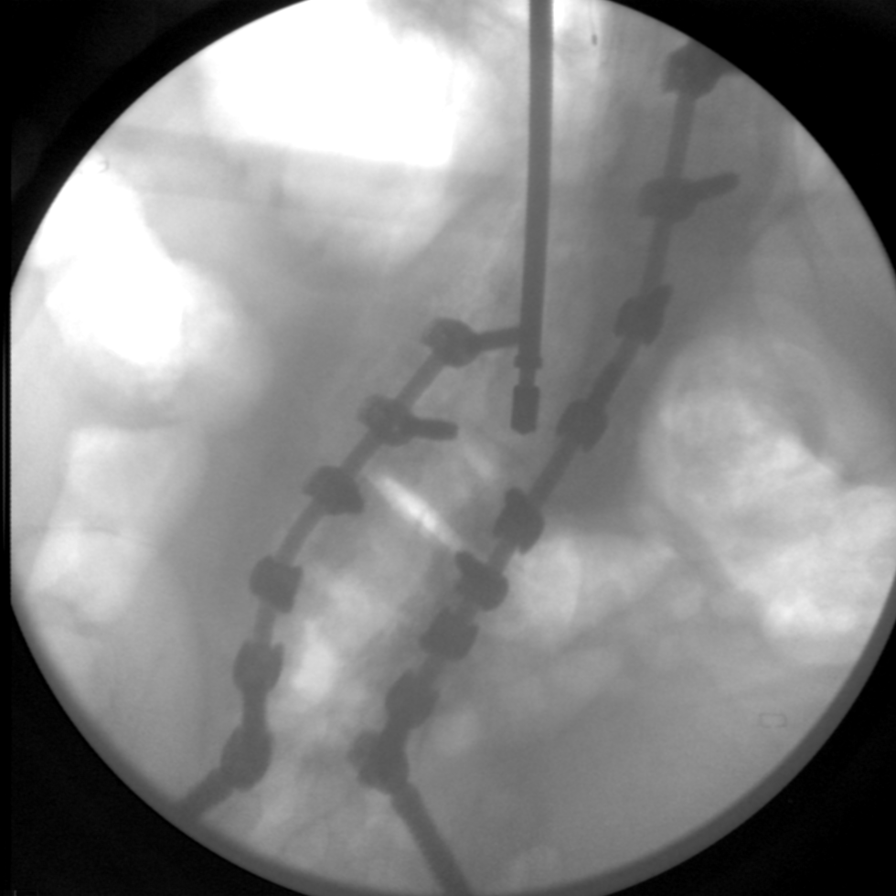
[im 2/7]
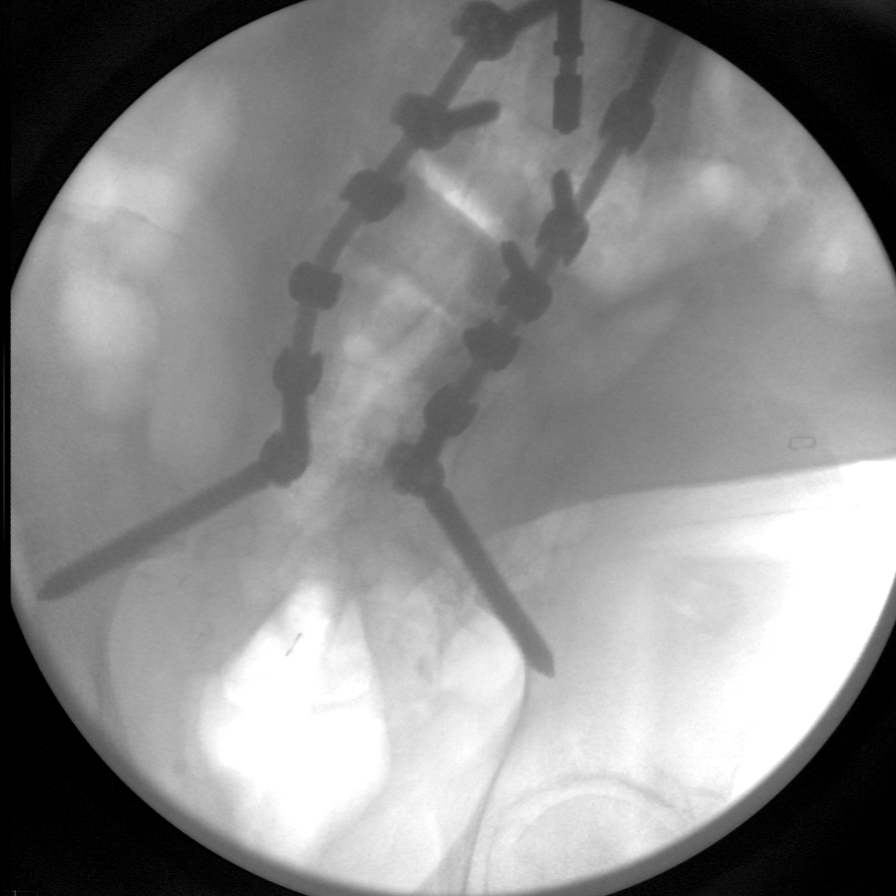
[im 3/7]
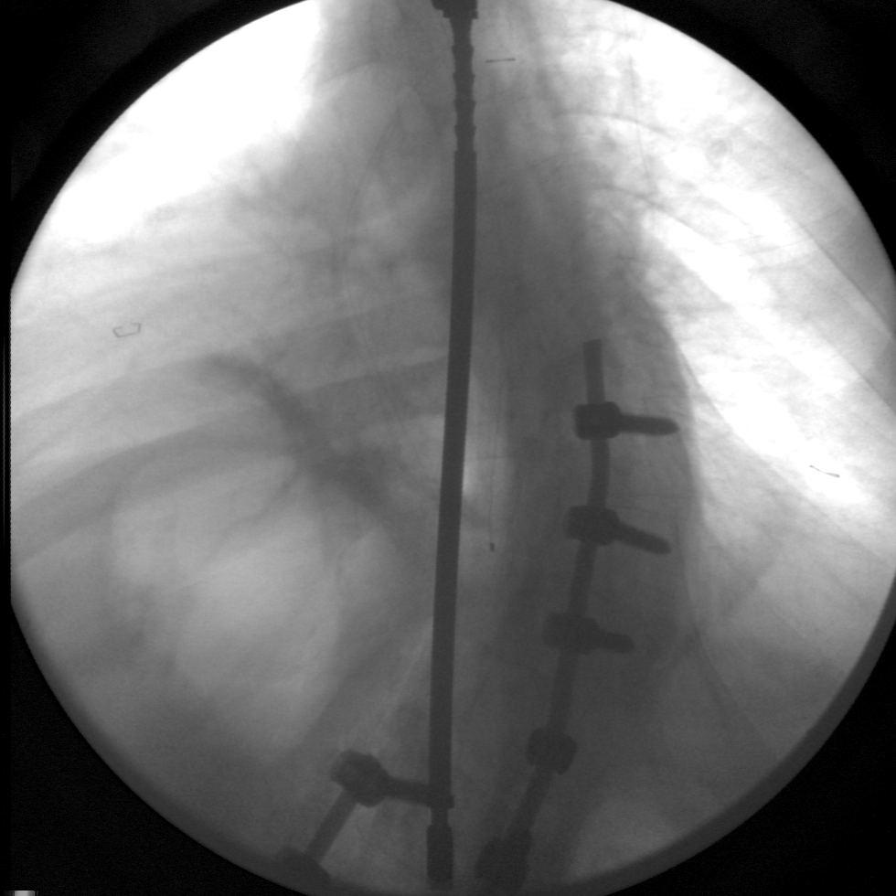
[im 4/7]
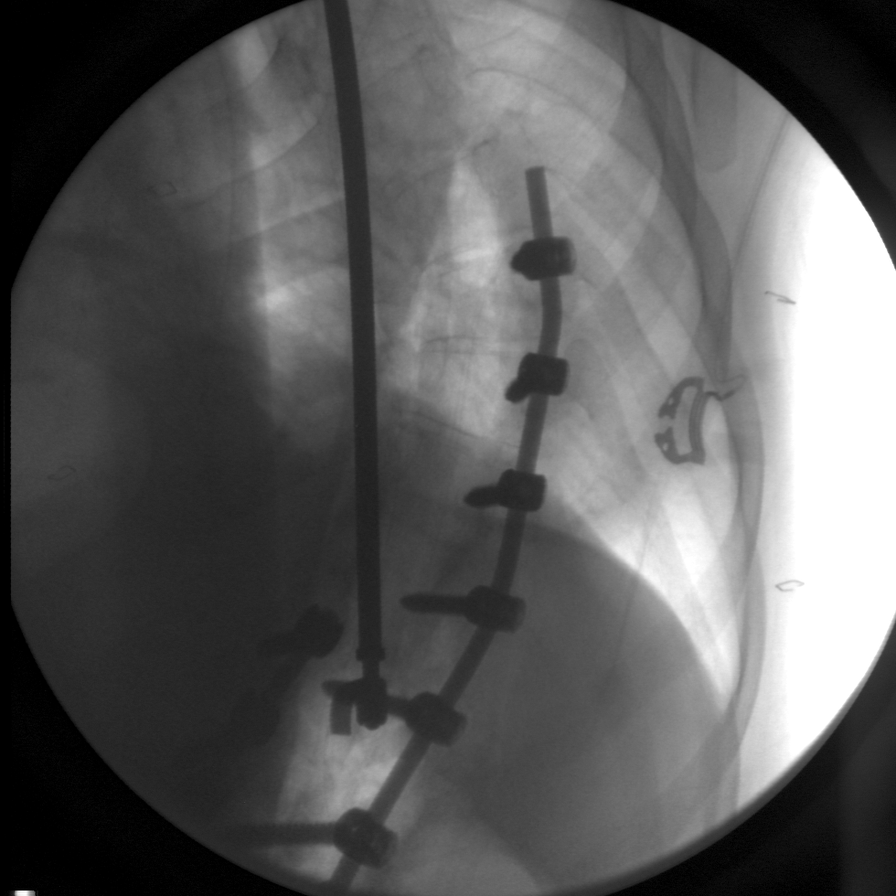
[im 5/7]
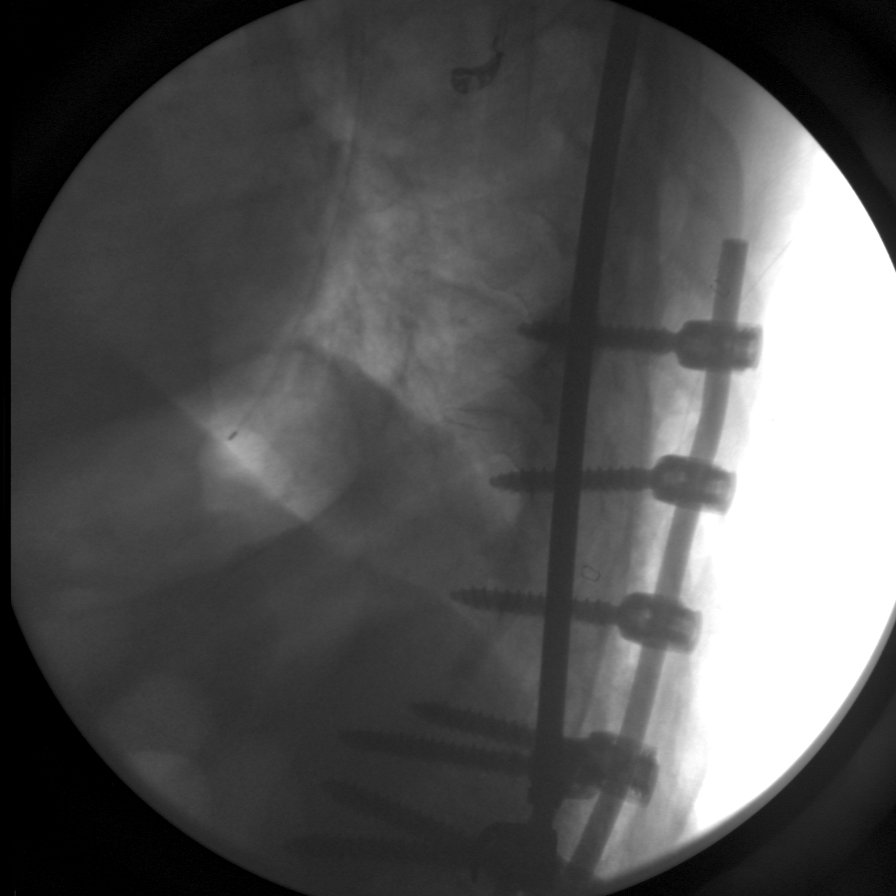
[im 6/7]
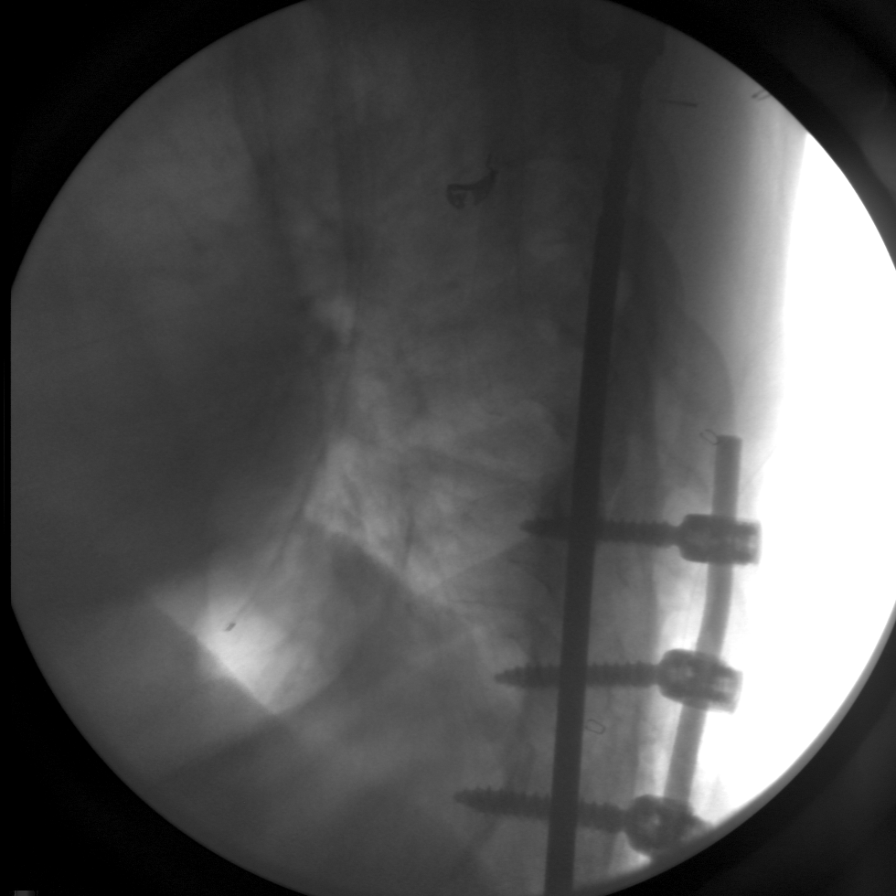
[im 7/7]
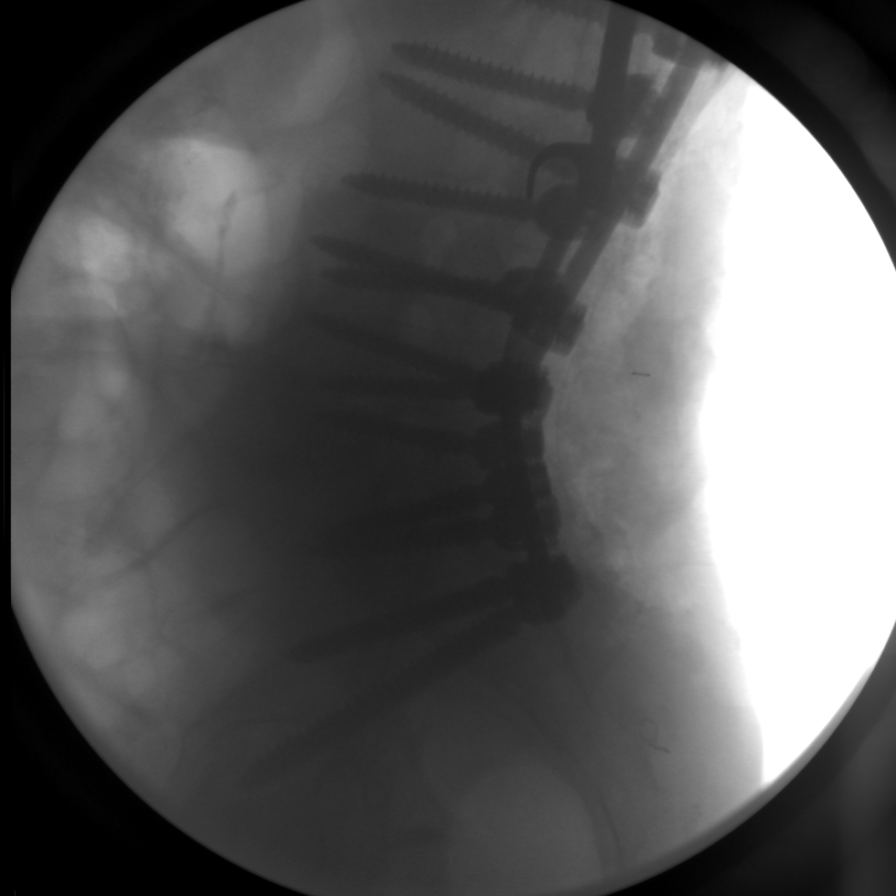

[7 of 7 positions shown; findings below may reference images not displayed]

FINDINGS: Fluoroscopic images were unchanged during placement of bilateral
thoracolumbar spinal rods and transpedicular screws. No immediate
hardware complication.
IMPRESSION: Intraoperative fluoroscopy during posterior spinal fixation.

## 2023-07-11 DEATH — deceased
